# Patient Record
Sex: Female | Born: 1980 | Race: White | Hispanic: No | Marital: Married | Smoking: Current every day smoker
Health system: Southern US, Community
[De-identification: ages and names within clinical notes are randomized; demographics above are authoritative.]

## PROBLEM LIST (undated history)

## (undated) DIAGNOSIS — K589 Irritable bowel syndrome without diarrhea: Secondary | ICD-10-CM

## (undated) DIAGNOSIS — G971 Other reaction to spinal and lumbar puncture: Secondary | ICD-10-CM

## (undated) DIAGNOSIS — N879 Dysplasia of cervix uteri, unspecified: Secondary | ICD-10-CM

## (undated) DIAGNOSIS — J45909 Unspecified asthma, uncomplicated: Secondary | ICD-10-CM

## (undated) HISTORY — DX: Unspecified asthma, uncomplicated: J45.909

## (undated) HISTORY — DX: Dysplasia of cervix uteri, unspecified: N87.9

## (undated) HISTORY — DX: Irritable bowel syndrome, unspecified: K58.9

## (undated) HISTORY — PX: WISDOM TOOTH EXTRACTION: SHX21

---
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
## 2003-01-14 ENCOUNTER — Emergency Department (HOSPITAL_COMMUNITY): Admission: EM | Admit: 2003-01-14 | Discharge: 2003-01-14 | Payer: Self-pay | Admitting: Emergency Medicine

## 2007-03-16 ENCOUNTER — Emergency Department (HOSPITAL_COMMUNITY): Admission: EM | Admit: 2007-03-16 | Discharge: 2007-03-17 | Payer: Self-pay | Admitting: Emergency Medicine

## 2007-03-28 ENCOUNTER — Emergency Department (HOSPITAL_COMMUNITY): Admission: EM | Admit: 2007-03-28 | Discharge: 2007-03-28 | Payer: Self-pay | Admitting: Emergency Medicine

## 2009-09-14 ENCOUNTER — Emergency Department (HOSPITAL_COMMUNITY): Admission: EM | Admit: 2009-09-14 | Discharge: 2009-09-14 | Payer: Self-pay | Admitting: Emergency Medicine

## 2009-10-25 ENCOUNTER — Emergency Department (HOSPITAL_COMMUNITY): Admission: EM | Admit: 2009-10-25 | Discharge: 2009-10-25 | Payer: Self-pay | Admitting: Emergency Medicine

## 2010-07-19 ENCOUNTER — Emergency Department (HOSPITAL_BASED_OUTPATIENT_CLINIC_OR_DEPARTMENT_OTHER)
Admission: EM | Admit: 2010-07-19 | Discharge: 2010-07-19 | Payer: Self-pay | Source: Home / Self Care | Admitting: Emergency Medicine

## 2010-08-30 ENCOUNTER — Encounter (INDEPENDENT_AMBULATORY_CARE_PROVIDER_SITE_OTHER): Payer: Self-pay | Admitting: *Deleted

## 2010-09-08 NOTE — Letter (Signed)
Summary: New Patient letter  [facility]  [address]   [place], [place] [address]   Phone: [phone]  Fax: [phone]       08/30/2010 MRN: [id number]  [facility] Kendra Hurley [address] [place], [place]  [address]  Dear Ms. Kendra Hurley,  Welcome to the Gastroenterology Division at [facility].    You are scheduled to see Dr.  Russella Dar on 09/19/2010 at 3:45 on the 3rd floor at [facility], [address]  We ask that you try to arrive at our office 15 minutes prior to your appointment time to allow for check-in.  We would like you to complete the enclosed self-administered evaluation form prior to your visit and bring it with you on the day of your appointment.  We will review it with you.  Also, please bring a complete list of all your medications or, if you prefer, bring the medication bottles and we will list them.  Please bring your insurance card so that we may make a copy of it.  If your insurance requires a referral to see a specialist, please bring your referral form from your primary care physician.  Co-payments are due at the time of your visit and may be paid by cash, check or credit card.     Your office visit will consist of a consult with your physician (includes a physical exam), any laboratory testing he/she may order, scheduling of any necessary diagnostic testing (e.g. x-ray, ultrasound, CT-scan), and scheduling of a procedure (e.g. Endoscopy, Colonoscopy) if required.  Please allow enough time on your schedule to allow for any/all of these possibilities.    If you cannot keep your appointment, please call [phone] to cancel or reschedule prior to your appointment date.  This allows [place] the opportunity to schedule an appointment for another patient in need of care.  If you do not cancel or reschedule by 5 p.m. the business day prior to your appointment date, you will be charged a $50.00 late cancellation/no-show fee.    Thank you for choosing  [facility] for your medical needs.  We appreciate the opportunity to care for you.  Please visit [place] at our website  to learn more about our practice.                     Sincerely,                                                             The Gastroenterology Division

## 2010-09-19 ENCOUNTER — Ambulatory Visit: Payer: Self-pay | Admitting: Gastroenterology

## 2010-10-03 LAB — URINE MICROSCOPIC-ADD ON

## 2010-10-03 LAB — BASIC METABOLIC PANEL
BUN: 12 mg/dL (ref 6–23)
Calcium: 9.2 mg/dL (ref 8.4–10.5)
Creatinine, Ser: 0.8 mg/dL (ref 0.4–1.2)
GFR calc Af Amer: >60 mL/min (ref >60)
GFR calc non Af Amer: >60 mL/min (ref >60)

## 2010-10-03 LAB — URINALYSIS, ROUTINE W REFLEX MICROSCOPIC
Glucose, UA: NEGATIVE mg/dL
Leukocytes, UA: NEGATIVE
Nitrite: NEGATIVE
Specific Gravity, Urine: 1.024 (ref 1.005–1.030)
pH: 5.5 (ref 5.0–8.0)

## 2010-10-03 LAB — DIFFERENTIAL
Basophils Absolute: 0 10*3/uL (ref 0.0–0.1)
Eosinophils Relative: 3 % (ref 0–5)
Lymphocytes Relative: 25 % (ref 12–46)
Neutro Abs: 4.6 10*3/uL (ref 1.7–7.7)
Neutrophils Relative %: 65 % (ref 43–77)

## 2010-10-03 LAB — CBC
MCV: 85.6 fL (ref 78.0–100.0)
Platelets: 162 10*3/uL (ref 150–400)
RDW: 13.1 % (ref 11.5–15.5)
WBC: 7.1 10*3/uL (ref 4.0–10.5)

## 2010-10-03 LAB — PREGNANCY, URINE: Preg Test, Ur: NEGATIVE

## 2010-10-12 LAB — COMPREHENSIVE METABOLIC PANEL
BUN: 7 mg/dL (ref 6–23)
Calcium: 8.7 mg/dL (ref 8.4–10.5)
Glucose, Bld: 90 mg/dL (ref 70–99)
Total Protein: 7 g/dL (ref 6.0–8.3)

## 2010-10-12 LAB — DIFFERENTIAL
Lymphs Abs: 1.4 10*3/uL (ref 0.7–4.0)
Monocytes Relative: 7 % (ref 3–12)
Neutro Abs: 3.7 10*3/uL (ref 1.7–7.7)
Neutrophils Relative %: 66 % (ref 43–77)

## 2010-10-12 LAB — URINALYSIS, ROUTINE W REFLEX MICROSCOPIC
Ketones, ur: NEGATIVE mg/dL
Nitrite: NEGATIVE
pH: 5.5 (ref 5.0–8.0)

## 2010-10-12 LAB — CBC
HCT: 41.5 % (ref 36.0–46.0)
Hemoglobin: 14 g/dL (ref 12.0–15.0)
MCHC: 33.7 g/dL (ref 30.0–36.0)
RDW: 13.6 % (ref 11.5–15.5)

## 2010-10-12 LAB — URINE MICROSCOPIC-ADD ON

## 2010-12-09 NOTE — Discharge Summary (Signed)
[facility]  Patient:    Kendra Hurley, Kendra Hurley                       MRN: [id number] Adm. Date:  [id number] Disc. Date: [id number] Attending:  Milford Cage H                           Discharge Summary  PATIENT IDENTIFICATION:  Patient was a 30 year old female.  INITIAL ASSESSMENT AND DIAGNOSIS:  Patient was admitted to the service of Dr. Milford Cage.  She had a history of mood instability and conduct problems.  She had been depressed and irritable and was admitted after threatening to commit suicide.  She had symptoms of loss of energy, loss of interest, trouble sleeping, 6-pound weigh loss over two months.  She had gotten into an argument with her mother after being brought home from a friends home.  She had run away before that and had been living with this friend for the past three weeks.  MENTAL STATUS:  At the time of the initial evaluation, revealed a reserved young woman with poor eye contact.  There was no evidence of any psychotic thinking or behavior.  Mood was depressed.  There was no homicidal ideation. She was positive for suicidal ideation, per history.  Short and long-term memory were intact.  Concentration was somewhat impaired.  Insight was minimal.  Judgment was poor.  She seemed to be at least average intelligence. Other pertinent history can be obtained from the psychosocial service summary.  PHYSICAL EXAMINATION:  Noncontributory.  ADMITTING DIAGNOSES: Axis I:    1. Mood disorder not otherwise specified.            2. Conduct disorder, adolescent onset.            3. Rule out marijuana abuse. Axis II:   Deferred. Axis III:  Bronchitis. Axis IV:   Severe. Axis V:    15.  FINDINGS:  All indicated laboratory examinations were within normal limits or noncontributory.  HOSPITAL COURSE:  While in the hospital, patient was no problem on the unit. She went to groups.  She talked about her issues.  She behaved well.  She did have  difficulty, however, in her family sessions where she was negativistic and reasonably hostile towards the parents indicating that they needed to make the changes and she did not.  Consequently, the first family session was shortened.  By the second family session, she realized that it was more appropriate to say that she was willing to work with the parents and work out some compromises.  She was particularly unhappy with her stepfather and the way he handled things.  Her mother indicated that the rules would basically stay the same, that she was their child and she will be living at their house one way or the other and, by the time of discharge, patient was saying she was accepting of that and would follow the rules.  They also worked out a plan that she would have tutoring next year rather than school and she seemed to accept that idea.  She consistently denied any threats towards herself or anyone else while she was in the hospital and consequently was discharged.  POST-HOSPITAL CARE PLANS:  The outpatient follow-up was to be arranged at the time of discharge and those results are not on the chart at this point.  DISCHARGE MEDICATIONS: 1. Amoxicillin 500 mg t.i.d. x 5 days.  2. Prozac 20 mg q.a.m.  ACTIVITY AND DIET:  There were no restrictions placed on her activity or her diet.  FINAL DIAGNOSES: Axis I:    1. Mood disorder not otherwise specified.            2. Conduct disorder, adolescent onset. Axis II:   No diagnosis. Axis III:  Bronchitis. Axis IV:   Severe. Axis V:    60. DD:  12/12/99 TD:  12/13/99 Job: [id number]

## 2010-12-09 NOTE — H&P (Signed)
[facility]  Patient:    Kendra Hurley, Kendra Hurley                       MRN: [id number] Adm. Date:  [id number] Attending:  Jasmine Pang                   Psychiatric Admission Assessment  DATE OF ADMISSION:  Nov 30, 1999  PATIENT IDENTIFICATION:  This 30 year old Caucasian female admitted on a voluntary basis.  HISTORY OF PRESENT ILLNESS:  Patient has a history of mood instability and conduct problems.  She has been depressed and irritable and was admitted after threatening to commit suicide.  Other neurovegetative symptoms include anhedonia, anergia, insomnia and a 6-pound weight loss in the past two months.  She got into an argument with her mother after being retrieved from a friends home.  She had run away and she had been living with this friend for the past three weeks.  PAST PSYCHIATRIC HISTORY:  [facility] last year. Patient was noncompliant.  Patient is on no medications.  She was on Zoloft but stopped taking this.  SUBSTANCE ABUSE HISTORY:  Patient has been abusing marijuana.  She smokes a half a pack of cigarettes per day for the past two years.  PAST MEDICAL HISTORY:  No known drug allergies.  Patient suffers from bronchitis.  She is currently on amoxicillin 500 mg p.o. t.i.d. x 10 days and an albuterol inhaler p.r.n.  SOCIAL HISTORY:  Patient lives with her mother.  She is in the ninth grade. She has been having difficulty at school due to her marijuana abuse.  There is no physical or sexual abuse.  Family psychiatric history:  None reported. Legal history:  None.  MENTAL STATUS EXAMINATION:  On admission, patient presented as a reserved Caucasian female with poor eye contact.  Speech was soft and slow.  There was no articulation disorder.  There was psychomotor retardation.  Mood was depressed.  Affect sad and constricted.  There was positive suicidal ideation as per history of present illness.  There was no  homicidal ideation.  There was no psychosis or perceptual disturbance.  Thought processes were logical and goal directed.  Thought content revealed no predominant theme.  On cognitive exam, patient was alert and oriented x 4.  The short-term and long-term memory were adequate.  The general fund of knowledge was age and education level appropriate.  Attention and concentration poor as assessed by her difficulty focusing.  Insight minimal and judgment poor.  ADMISSION DIAGNOSES: Axis I:    1. Mood disorder not otherwise specified.            2. Conduct disorder, adolescent onset.            3. Rule out marijuana abuse. Axis II:   Deferred. Axis III:  Bronchitis. Axis IV:   Severe. Axis V:    GAF 15.  ASSETS AND STRENGTHS:  Patient is healthy and able to be physically active. Patient has a supportive family.  PROBLEM:  Mood instability with suicidal ideation.  SHORT-TERM TREATMENT GOAL:  Resolution of suicidal ideation.  LONG-TERM TREATMENT GOAL:  Resolution of mood instability.  INITIAL PLAN OF CARE:  Begin Depakote 125 mg p.o. t.i.d. x 2 days; then 250 mg p.o. t.i.d.  May also start an SSRI for depression after observing for awhile longer.  Patient will be involved in unit therapeutic groups and activities. Patient will be involved in family therapy.  ESTIMATED LENGTH OF STAY:  Five to seven days.  CONDITIONS NECESSARY FOR DISCHARGE:  Not suicidal.  POST HOSPITAL CARE PLAN:  Patient will return home to live with her family. Follow-up therapy and medications management will be at the [facility]. DD:  11/30/99 TD:  12/05/99 Job: [id number]

## 2011-08-31 ENCOUNTER — Emergency Department (HOSPITAL_COMMUNITY): Payer: PRIVATE HEALTH INSURANCE

## 2011-08-31 ENCOUNTER — Ambulatory Visit (INDEPENDENT_AMBULATORY_CARE_PROVIDER_SITE_OTHER): Payer: PRIVATE HEALTH INSURANCE | Admitting: Emergency Medicine

## 2011-08-31 ENCOUNTER — Emergency Department (HOSPITAL_COMMUNITY)
Admission: EM | Admit: 2011-08-31 | Discharge: 2011-08-31 | Disposition: A | Discharge status: Home / Self Care | Payer: PRIVATE HEALTH INSURANCE | Attending: Emergency Medicine | Admitting: Emergency Medicine

## 2011-08-31 DIAGNOSIS — R42 Dizziness and giddiness: Secondary | ICD-10-CM | POA: Insufficient documentation

## 2011-08-31 DIAGNOSIS — G4700 Insomnia, unspecified: Secondary | ICD-10-CM | POA: Insufficient documentation

## 2011-08-31 DIAGNOSIS — R110 Nausea: Secondary | ICD-10-CM | POA: Insufficient documentation

## 2011-08-31 DIAGNOSIS — R519 Headache, unspecified: Secondary | ICD-10-CM | POA: Insufficient documentation

## 2011-08-31 DIAGNOSIS — R279 Unspecified lack of coordination: Secondary | ICD-10-CM

## 2011-08-31 DIAGNOSIS — R51 Headache: Secondary | ICD-10-CM | POA: Insufficient documentation

## 2011-08-31 DIAGNOSIS — H8110 Benign paroxysmal vertigo, unspecified ear: Secondary | ICD-10-CM

## 2011-08-31 DIAGNOSIS — R630 Anorexia: Secondary | ICD-10-CM | POA: Insufficient documentation

## 2011-08-31 DIAGNOSIS — IMO0001 Reserved for inherently not codable concepts without codable children: Secondary | ICD-10-CM | POA: Insufficient documentation

## 2011-08-31 DIAGNOSIS — H53149 Visual discomfort, unspecified: Secondary | ICD-10-CM | POA: Insufficient documentation

## 2011-08-31 LAB — URINE MICROSCOPIC-ADD ON

## 2011-08-31 LAB — BASIC METABOLIC PANEL
CO2: 23 mEq/L (ref 19–32)
Calcium: 8.8 mg/dL (ref 8.4–10.5)
Creatinine, Ser: 0.7 mg/dL (ref 0.50–1.10)
Glucose, Bld: 89 mg/dL (ref 70–99)
Sodium: 143 mEq/L (ref 135–145)

## 2011-08-31 LAB — URINALYSIS, ROUTINE W REFLEX MICROSCOPIC
Bilirubin Urine: NEGATIVE
Glucose, UA: NEGATIVE mg/dL
Ketones, ur: NEGATIVE mg/dL
Leukocytes, UA: NEGATIVE
Nitrite: NEGATIVE
Protein, ur: NEGATIVE mg/dL
Specific Gravity, Urine: 1.006 (ref 1.005–1.030)
Urobilinogen, UA: 0.2 mg/dL (ref 0.0–1.0)
pH: 6 (ref 5.0–8.0)

## 2011-08-31 LAB — CBC
Hemoglobin: 13.6 g/dL (ref 12.0–15.0)
MCH: 29.6 pg (ref 26.0–34.0)
MCV: 88.5 fL (ref 78.0–100.0)
RBC: 4.6 MIL/uL (ref 3.87–5.11)

## 2011-08-31 LAB — PREGNANCY, URINE: Preg Test, Ur: NEGATIVE

## 2011-08-31 MED ORDER — KETOROLAC TROMETHAMINE 30 MG/ML IJ SOLN
30.0000 mg | Freq: Once | INTRAMUSCULAR | Status: AC
  Administered 2011-08-31: 30 mg via INTRAVENOUS
  Filled 2011-08-31: qty 1

## 2011-08-31 MED ORDER — ONDANSETRON 4 MG PO TBDP: 4.0000 mg | ORAL_TABLET | Freq: Once | ORAL | Status: DC

## 2011-08-31 MED ORDER — PROMETHAZINE HCL 25 MG/ML IJ SOLN
12.5000 mg | Freq: Once | INTRAMUSCULAR | Status: AC
  Administered 2011-08-31: 12.5 mg via INTRAVENOUS
  Filled 2011-08-31 (×2): qty 1

## 2011-08-31 MED ORDER — FENTANYL CITRATE 0.05 MG/ML IJ SOLN
100.0000 ug | Freq: Once | INTRAMUSCULAR | Status: AC
  Administered 2011-08-31: 100 ug via INTRAVENOUS
  Filled 2011-08-31: qty 2

## 2011-08-31 MED ORDER — PROMETHAZINE HCL 25 MG/ML IJ SOLN
12.5000 mg | INTRAMUSCULAR | Status: AC
  Administered 2011-08-31: 16:00:00 via INTRAVENOUS
  Filled 2011-08-31: qty 1

## 2011-08-31 MED ORDER — SODIUM CHLORIDE 0.9 % IV BOLUS (SEPSIS)
2000.0000 mL | Freq: Once | INTRAVENOUS | Status: AC
  Administered 2011-08-31: 2000 mL via INTRAVENOUS

## 2011-08-31 MED ORDER — NAPROXEN 500 MG PO TABS: 500.0000 mg | ORAL_TABLET | Freq: Two times a day (BID) | ORAL | Status: AC

## 2011-08-31 MED ORDER — SODIUM CHLORIDE 0.9 % IV SOLN
999.0000 mL | Freq: Once | INTRAVENOUS | Status: AC
  Administered 2011-08-31: 999 mL via INTRAVENOUS

## 2011-08-31 MED ORDER — DIPHENHYDRAMINE HCL 50 MG/ML IJ SOLN
25.0000 mg | Freq: Once | INTRAMUSCULAR | Status: AC
  Administered 2011-08-31: 25 mg via INTRAVENOUS
  Filled 2011-08-31: qty 1

## 2011-08-31 NOTE — ED Notes (Signed)
Pt tearful with c/o migraine at 10/10 and nausea w/o emesis.  IV to LAC #20 is patient.

## 2011-08-31 NOTE — ED Notes (Signed)
Pt evaluated by orthostatic VS in which the pt tolerated well with no syncope.

## 2011-08-31 NOTE — Progress Notes (Signed)
  Subjective:    Patient ID: Kendra Hurley, female    DOB: [birth year], 31 y.o.   MRN: [id number]  HPI most of the history is obtained from the husband. Patient presents with approximately 2 week history of just feeling poorly decreased appetite fatigue. He is intermittently had some headache morning of admission she presented with a severe headache that was associated with difficulty walking she did not complain of true diplopia but did have vertiginous symptoms when she tried to ambulate. She's been nauseated through the day she is complaining of a severe generalized headache patient was brought back emergently.    Review of Systems as described in the present     Objective:   Physical Exam patient needed assistance to be brought back she was unable to walk without assistance she was put on the exam table she kept her eyes closed shut but after an been talked to by the husband she was able to open them her pupils did appear to be reactive at times she did appear to have some disconjugate gaze but that could not be confirmed on repeat testing are small symmetric no other evidence of facial weakness. She did not have any decreased grip strength is able to elevate both legs. Cardiac exam was unremarkable and chin no carotid bruits. When asked about her husband's cell phone she was not able to give that number. She was able to give me her name but did not get the urine        Assessment & Plan:  Unclear what the etiology of her symptoms are related to be she did not have any focal neurological findings her main complaint was of headache vertigo and ataxia. She was sent emergently to the hospital for evaluation. The glucose was checked in the office and within normal limits. IV access was obtained and she was given 4 mg of sublingual Zofran.

## 2011-08-31 NOTE — ED Provider Notes (Signed)
Medical screening examination/treatment/procedure(s) were performed by non-physician practitioner and as supervising physician I was immediately available for consultation/collaboration.  Ethelda Chick, MD 08/31/11 [phone]

## 2011-08-31 NOTE — ED Provider Notes (Signed)
Headache improved.  Labs normal.  Patient states she is ready to return home.  Jimmye Norman, NP 08/31/11 1719

## 2011-08-31 NOTE — ED Notes (Signed)
Had intermittent nausea for several weeks, migraine that started several days ago.  Migraine became worse at 0830 today.  Pt reports photophobia and nosebleed this am.  No hx of HTN.

## 2011-08-31 NOTE — ED Provider Notes (Signed)
History     CSN: [id number]  Arrival date & time 08/31/11  1141   First MD Initiated Contact with Patient 08/31/11 1332      Chief Complaint  Patient presents with  . Migraine    photophobia  . Nausea    w/o emesis    (Consider location/radiation/quality/duration/timing/severity/associated sxs/prior treatment) HPI Patient presents to the ED today with a severe headache of 4 days duration. She states that she has had body aches for the past few weeks and there has been some headache during that time. She has also felt nauseous for the past few weeks, experiencing insomnia, and decreased appetite. She started having the headache on Monday and it has been a constant throbbing pain over her entire head. She has never had a headache this bad before. She does not have a history of migraines. Patient went to the urgent care this morning because her headache was worse and she had a nose bleed. She also reports dizziness, complaining of the room spinning, even when lying down. When asked about neck stiffness/pain she reports that her whole body hurts. She denies fever, chills, sweats, chest pain, shortness of breath, abdominal pain. She does not take any medications regularly. She has no allergies. No past medical history on file.  No past surgical history on file.  No family history on file.  History  Substance Use Topics  . Smoking status: Not on file  . Smokeless tobacco: Not on file  . Alcohol Use: Not on file    OB History    No data available      Review of Systems All pertinent positives and negatives reviewed in the history of present illness  Allergies  Review of patient's allergies indicates no known allergies.  Home Medications   Current Outpatient Rx  Name Route Sig Dispense Refill  . IBUPROFEN 200 MG PO TABS Oral Take 400 mg by mouth every 6 (six) hours as needed. For pain      BP 103/60  Pulse 70  Temp(Src) 98 F (36.7 C) (Oral)  Resp 16  SpO2 100%  LMP  08/28/2011  Physical Exam  Constitutional: She is oriented to person, place, and time. She appears well-developed and well-nourished. She appears distressed.  HENT:  Head: Normocephalic and atraumatic.  Mouth/Throat: Oropharynx is clear and moist. No oropharyngeal exudate.  Eyes: EOM are normal. Pupils are equal, round, and reactive to light.  Neck: Normal range of motion and full passive range of motion without pain. Neck supple. No rigidity. Normal range of motion present. No Brudzinski's sign and no Kernig's sign noted.  Cardiovascular: Normal rate, regular rhythm, normal heart sounds and intact distal pulses.  Exam reveals no friction rub.   No murmur heard. Pulmonary/Chest: Effort normal and breath sounds normal. No respiratory distress. She has no wheezes. She has no rales.  Abdominal: Soft. Bowel sounds are normal. She exhibits no distension. There is no tenderness. There is no rebound and no guarding.  Musculoskeletal: Normal range of motion.  Neurological: She is alert and oriented to person, place, and time. No cranial nerve deficit. She exhibits normal muscle tone. Coordination normal.  Skin: Skin is warm and dry. No rash noted.    ED Course  Procedures (including critical care time)  Labs Reviewed  CBC - Abnormal; Notable for the following:    Platelets 137 (*)    All other components within normal limits  BASIC METABOLIC PANEL  PREGNANCY, URINE  URINALYSIS, ROUTINE W REFLEX MICROSCOPIC  Ct Head Wo Contrast  08/31/2011  *RADIOLOGY REPORT*  Clinical Data: Migraine.  Nausea.  CT HEAD WITHOUT CONTRAST  Technique:  Contiguous axial images were obtained from the base of the skull through the vertex without contrast.  Comparison: None.  Findings: No mass effect, midline shift, or acute intracranial hemorrhage.  Ventricles system and extraaxial space are unremarkable.  Mastoid air cells are clear.  Visualized paranasal sinuses are clear.  IMPRESSION: Negative head CT.  Original  Report Authenticated By: Donavan Burnet, M.D.     The patient will be hydrated and monitored. The patient has no meningeal signs on exam. She has been stable while here in the department.     MDM          Carlyle Dolly, PA-C 08/31/11 [phone]

## 2011-08-31 NOTE — ED Notes (Signed)
Pt can follow commands, shrug shoulders.  No neuro deficits noted.  Pt grips and pedal pushes are WNL.  She still c/o headache and nausea w/o emesis.  Husband and friend at bedside.

## 2011-08-31 NOTE — ED Notes (Signed)
Pt has urine at bedside 

## 2011-08-31 NOTE — ED Provider Notes (Signed)
Medical screening examination/treatment/procedure(s) were performed by non-physician practitioner and as supervising physician I was immediately available for consultation/collaboration.    Glynn Octave, MD 08/31/11 2035

## 2012-04-19 LAB — OB RESULTS CONSOLE ABO/RH

## 2012-04-19 LAB — OB RESULTS CONSOLE RPR: RPR: NONREACTIVE

## 2012-04-19 LAB — OB RESULTS CONSOLE RUBELLA ANTIBODY, IGM: Rubella: IMMUNE

## 2012-04-19 LAB — OB RESULTS CONSOLE HIV ANTIBODY (ROUTINE TESTING): HIV: NONREACTIVE

## 2012-11-11 ENCOUNTER — Inpatient Hospital Stay (HOSPITAL_COMMUNITY): Payer: PRIVATE HEALTH INSURANCE | Admitting: Anesthesiology

## 2012-11-11 ENCOUNTER — Encounter (HOSPITAL_COMMUNITY): Payer: Self-pay | Admitting: Anesthesiology

## 2012-11-11 ENCOUNTER — Inpatient Hospital Stay (HOSPITAL_COMMUNITY)
Admission: RE | Admit: 2012-11-11 | Discharge: 2012-11-14 | DRG: 766 | Disposition: A | Discharge status: Home / Self Care | Payer: PRIVATE HEALTH INSURANCE | Source: Ambulatory Visit | Attending: Obstetrics and Gynecology | Admitting: Obstetrics and Gynecology

## 2012-11-11 ENCOUNTER — Encounter (HOSPITAL_COMMUNITY)
Admission: RE | Disposition: A | Discharge status: Home / Self Care | Payer: Self-pay | Source: Ambulatory Visit | Attending: Obstetrics and Gynecology

## 2012-11-11 ENCOUNTER — Encounter (HOSPITAL_COMMUNITY): Payer: Self-pay | Admitting: *Deleted

## 2012-11-11 DIAGNOSIS — Z302 Encounter for sterilization: Secondary | ICD-10-CM

## 2012-11-11 DIAGNOSIS — O34219 Maternal care for unspecified type scar from previous cesarean delivery: Principal | ICD-10-CM | POA: Diagnosis present

## 2012-11-11 HISTORY — DX: Other reaction to spinal and lumbar puncture: G97.1

## 2012-11-11 HISTORY — PX: TUBAL LIGATION: SHX77

## 2012-11-11 LAB — CBC
HCT: 34.2 % — ABNORMAL LOW (ref 36.0–46.0)
Hemoglobin: 11.4 g/dL — ABNORMAL LOW (ref 12.0–15.0)
MCHC: 33.3 g/dL (ref 30.0–36.0)
RBC: 3.77 MIL/uL — ABNORMAL LOW (ref 3.87–5.11)

## 2012-11-11 LAB — TYPE AND SCREEN: Antibody Screen: NEGATIVE

## 2012-11-11 LAB — RPR: RPR Ser Ql: REACTIVE — AB

## 2012-11-11 SURGERY — Surgical Case
Anesthesia: Spinal | Wound class: Clean Contaminated

## 2012-11-11 MED ORDER — MORPHINE SULFATE 0.5 MG/ML IJ SOLN
INTRAMUSCULAR | Status: AC
  Filled 2012-11-11: qty 10

## 2012-11-11 MED ORDER — NALBUPHINE SYRINGE 5 MG/0.5 ML
5.0000 mg | INJECTION | INTRAMUSCULAR | Status: DC | PRN
  Filled 2012-11-11: qty 1

## 2012-11-11 MED ORDER — FLEET ENEMA 7-19 GM/118ML RE ENEM: 1.0000 | ENEMA | RECTAL | Status: DC | PRN

## 2012-11-11 MED ORDER — FENTANYL CITRATE 0.05 MG/ML IJ SOLN
INTRAMUSCULAR | Status: DC | PRN
  Administered 2012-11-11: 25 ug via INTRATHECAL

## 2012-11-11 MED ORDER — DIPHENHYDRAMINE HCL 50 MG/ML IJ SOLN
INTRAMUSCULAR | Status: DC | PRN
  Administered 2012-11-11 (×3): 12.5 mg via INTRAVENOUS

## 2012-11-11 MED ORDER — EPHEDRINE 5 MG/ML INJ
INTRAVENOUS | Status: AC
  Filled 2012-11-11: qty 10

## 2012-11-11 MED ORDER — FENTANYL CITRATE 0.05 MG/ML IJ SOLN
INTRAMUSCULAR | Status: AC
  Filled 2012-11-11: qty 4

## 2012-11-11 MED ORDER — LIDOCAINE HCL (PF) 1 % IJ SOLN: 30.0000 mL | INTRAMUSCULAR | Status: DC | PRN

## 2012-11-11 MED ORDER — ONDANSETRON HCL 4 MG/2ML IJ SOLN
INTRAMUSCULAR | Status: AC
  Filled 2012-11-11: qty 2

## 2012-11-11 MED ORDER — SCOPOLAMINE 1 MG/3DAYS TD PT72
MEDICATED_PATCH | TRANSDERMAL | Status: AC
  Administered 2012-11-11: 1.5 mg via TRANSDERMAL
  Filled 2012-11-11: qty 1

## 2012-11-11 MED ORDER — DIPHENHYDRAMINE HCL 50 MG/ML IJ SOLN: 12.5000 mg | INTRAMUSCULAR | Status: DC | PRN

## 2012-11-11 MED ORDER — LACTATED RINGERS IV SOLN: Freq: Once | INTRAVENOUS | Status: DC

## 2012-11-11 MED ORDER — IBUPROFEN 600 MG PO TABS: 600.0000 mg | ORAL_TABLET | Freq: Four times a day (QID) | ORAL | Status: DC | PRN

## 2012-11-11 MED ORDER — PHENYLEPHRINE 40 MCG/ML (10ML) SYRINGE FOR IV PUSH (FOR BLOOD PRESSURE SUPPORT)
PREFILLED_SYRINGE | INTRAVENOUS | Status: AC
  Filled 2012-11-11: qty 5

## 2012-11-11 MED ORDER — ONDANSETRON HCL 4 MG/2ML IJ SOLN
INTRAMUSCULAR | Status: DC | PRN
  Administered 2012-11-11: 4 mg via INTRAVENOUS

## 2012-11-11 MED ORDER — HYDROMORPHONE HCL PF 1 MG/ML IJ SOLN
0.2500 mg | INTRAMUSCULAR | Status: DC | PRN
  Administered 2012-11-11 (×2): 0.5 mg via INTRAVENOUS

## 2012-11-11 MED ORDER — DIBUCAINE 1 % RE OINT: 1.0000 "application " | TOPICAL_OINTMENT | RECTAL | Status: DC | PRN

## 2012-11-11 MED ORDER — TETANUS-DIPHTH-ACELL PERTUSSIS 5-2.5-18.5 LF-MCG/0.5 IM SUSP: 0.5000 mL | Freq: Once | INTRAMUSCULAR | Status: DC

## 2012-11-11 MED ORDER — OXYCODONE-ACETAMINOPHEN 5-325 MG PO TABS: 1.0000 | ORAL_TABLET | ORAL | Status: DC | PRN

## 2012-11-11 MED ORDER — SIMETHICONE 80 MG PO CHEW
80.0000 mg | CHEWABLE_TABLET | Freq: Three times a day (TID) | ORAL | Status: DC
  Administered 2012-11-11 – 2012-11-12 (×2): 80 mg via ORAL

## 2012-11-11 MED ORDER — DIPHENHYDRAMINE HCL 50 MG/ML IJ SOLN: 25.0000 mg | INTRAMUSCULAR | Status: DC | PRN

## 2012-11-11 MED ORDER — ZOLPIDEM TARTRATE 5 MG PO TABS: 5.0000 mg | ORAL_TABLET | Freq: Every evening | ORAL | Status: DC | PRN

## 2012-11-11 MED ORDER — LACTATED RINGERS IV SOLN
INTRAVENOUS | Status: DC | PRN
  Administered 2012-11-11 (×5): via INTRAVENOUS

## 2012-11-11 MED ORDER — ACETAMINOPHEN 325 MG PO TABS: 650.0000 mg | ORAL_TABLET | ORAL | Status: DC | PRN

## 2012-11-11 MED ORDER — MEPERIDINE HCL 25 MG/ML IJ SOLN: 6.2500 mg | INTRAMUSCULAR | Status: DC | PRN

## 2012-11-11 MED ORDER — LANOLIN HYDROUS EX OINT: 1.0000 "application " | TOPICAL_OINTMENT | CUTANEOUS | Status: DC | PRN

## 2012-11-11 MED ORDER — KETOROLAC TROMETHAMINE 30 MG/ML IJ SOLN: 30.0000 mg | Freq: Four times a day (QID) | INTRAMUSCULAR | Status: DC | PRN

## 2012-11-11 MED ORDER — LACTATED RINGERS IV SOLN: INTRAVENOUS | Status: DC

## 2012-11-11 MED ORDER — ACETAMINOPHEN 10 MG/ML IV SOLN
1000.0000 mg | Freq: Four times a day (QID) | INTRAVENOUS | Status: DC | PRN
  Administered 2012-11-12: 1000 mg via INTRAVENOUS
  Filled 2012-11-11 (×2): qty 100

## 2012-11-11 MED ORDER — SODIUM CHLORIDE 0.9 % IJ SOLN: 3.0000 mL | INTRAMUSCULAR | Status: DC | PRN

## 2012-11-11 MED ORDER — NALOXONE HCL 0.4 MG/ML IJ SOLN: 0.4000 mg | INTRAMUSCULAR | Status: DC | PRN

## 2012-11-11 MED ORDER — SIMETHICONE 80 MG PO CHEW: 80.0000 mg | CHEWABLE_TABLET | ORAL | Status: DC | PRN

## 2012-11-11 MED ORDER — NALOXONE HCL 1 MG/ML IJ SOLN
1.0000 ug/kg/h | INTRAVENOUS | Status: DC | PRN
  Filled 2012-11-11: qty 2

## 2012-11-11 MED ORDER — CITRIC ACID-SODIUM CITRATE 334-500 MG/5ML PO SOLN: 30.0000 mL | ORAL | Status: DC | PRN

## 2012-11-11 MED ORDER — MENTHOL 3 MG MT LOZG: 1.0000 | LOZENGE | OROMUCOSAL | Status: DC | PRN

## 2012-11-11 MED ORDER — SCOPOLAMINE 1 MG/3DAYS TD PT72: 1.0000 | MEDICATED_PATCH | Freq: Once | TRANSDERMAL | Status: DC

## 2012-11-11 MED ORDER — WITCH HAZEL-GLYCERIN EX PADS: 1.0000 "application " | MEDICATED_PAD | CUTANEOUS | Status: DC | PRN

## 2012-11-11 MED ORDER — CEFAZOLIN SODIUM-DEXTROSE 2-3 GM-% IV SOLR: 2.0000 g | Freq: Once | INTRAVENOUS | Status: DC

## 2012-11-11 MED ORDER — DIPHENHYDRAMINE HCL 50 MG/ML IJ SOLN
INTRAMUSCULAR | Status: AC
  Filled 2012-11-11: qty 1

## 2012-11-11 MED ORDER — KETOROLAC TROMETHAMINE 60 MG/2ML IM SOLN
INTRAMUSCULAR | Status: AC
  Administered 2012-11-11: 60 mg via INTRAMUSCULAR
  Filled 2012-11-11: qty 2

## 2012-11-11 MED ORDER — SENNOSIDES-DOCUSATE SODIUM 8.6-50 MG PO TABS
2.0000 | ORAL_TABLET | Freq: Every day | ORAL | Status: DC
  Administered 2012-11-11: 2 via ORAL

## 2012-11-11 MED ORDER — EPHEDRINE SULFATE 50 MG/ML IJ SOLN
INTRAMUSCULAR | Status: DC | PRN
  Administered 2012-11-11 (×4): 5 mg via INTRAVENOUS

## 2012-11-11 MED ORDER — CEFAZOLIN SODIUM-DEXTROSE 2-3 GM-% IV SOLR
INTRAVENOUS | Status: AC
  Filled 2012-11-11: qty 50

## 2012-11-11 MED ORDER — LACTATED RINGERS IV SOLN
INTRAVENOUS | Status: DC
  Administered 2012-11-11: 18:00:00 via INTRAVENOUS

## 2012-11-11 MED ORDER — NALBUPHINE SYRINGE 5 MG/0.5 ML
INJECTION | INTRAMUSCULAR | Status: AC
  Administered 2012-11-11: 5 mg via SUBCUTANEOUS
  Filled 2012-11-11: qty 1

## 2012-11-11 MED ORDER — OXYTOCIN 10 UNIT/ML IJ SOLN
40.0000 [IU] | INTRAVENOUS | Status: DC | PRN
  Administered 2012-11-11: 40 [IU] via INTRAVENOUS

## 2012-11-11 MED ORDER — OXYTOCIN BOLUS FROM INFUSION
500.0000 mL | INTRAVENOUS | Status: DC
Start: 2012-11-11 — End: 2012-11-11

## 2012-11-11 MED ORDER — PRENATAL MULTIVITAMIN CH: 1.0000 | ORAL_TABLET | Freq: Every day | ORAL | Status: DC

## 2012-11-11 MED ORDER — DIPHENHYDRAMINE HCL 25 MG PO CAPS
25.0000 mg | ORAL_CAPSULE | ORAL | Status: DC | PRN
  Filled 2012-11-11: qty 1

## 2012-11-11 MED ORDER — MORPHINE SULFATE (PF) 0.5 MG/ML IJ SOLN
INTRAMUSCULAR | Status: DC | PRN
  Administered 2012-11-11: .15 mg via INTRATHECAL

## 2012-11-11 MED ORDER — PHENYLEPHRINE HCL 10 MG/ML IJ SOLN
INTRAMUSCULAR | Status: DC | PRN
  Administered 2012-11-11 (×5): 40 ug via INTRAVENOUS

## 2012-11-11 MED ORDER — LACTATED RINGERS IV SOLN
INTRAVENOUS | Status: DC
  Administered 2012-11-11 (×2): via INTRAVENOUS

## 2012-11-11 MED ORDER — HYDROMORPHONE HCL PF 1 MG/ML IJ SOLN
INTRAMUSCULAR | Status: AC
  Filled 2012-11-11: qty 1

## 2012-11-11 MED ORDER — LACTATED RINGERS IV SOLN: 500.0000 mL | INTRAVENOUS | Status: DC | PRN

## 2012-11-11 MED ORDER — CEFAZOLIN SODIUM-DEXTROSE 2-3 GM-% IV SOLR
INTRAVENOUS | Status: DC | PRN
  Administered 2012-11-11: 2 g via INTRAVENOUS

## 2012-11-11 MED ORDER — KETOROLAC TROMETHAMINE 60 MG/2ML IM SOLN: 60.0000 mg | Freq: Once | INTRAMUSCULAR | Status: AC | PRN

## 2012-11-11 MED ORDER — HYDROMORPHONE HCL PF 1 MG/ML IJ SOLN
INTRAMUSCULAR | Status: AC
  Administered 2012-11-11: 0.5 mg via INTRAVENOUS
  Filled 2012-11-11: qty 1

## 2012-11-11 MED ORDER — OXYTOCIN 10 UNIT/ML IJ SOLN
INTRAMUSCULAR | Status: AC
  Filled 2012-11-11: qty 4

## 2012-11-11 MED ORDER — ONDANSETRON HCL 4 MG/2ML IJ SOLN: 4.0000 mg | Freq: Three times a day (TID) | INTRAMUSCULAR | Status: DC | PRN

## 2012-11-11 MED ORDER — IBUPROFEN 600 MG PO TABS
600.0000 mg | ORAL_TABLET | Freq: Four times a day (QID) | ORAL | Status: DC
  Administered 2012-11-12 (×2): 600 mg via ORAL
  Filled 2012-11-11 (×2): qty 1

## 2012-11-11 MED ORDER — OXYTOCIN 40 UNITS IN LACTATED RINGERS INFUSION - SIMPLE MED: 62.5000 mL/h | INTRAVENOUS | Status: DC

## 2012-11-11 MED ORDER — METOCLOPRAMIDE HCL 5 MG/ML IJ SOLN: 10.0000 mg | Freq: Three times a day (TID) | INTRAMUSCULAR | Status: DC | PRN

## 2012-11-11 MED ORDER — LACTATED RINGERS IV SOLN
INTRAVENOUS | Status: DC | PRN
  Administered 2012-11-11 (×2): via INTRAVENOUS

## 2012-11-11 SURGICAL SUPPLY — 36 items
BENZOIN TINCTURE PRP APPL 2/3 (GAUZE/BANDAGES/DRESSINGS) ×4 IMPLANT
CLOTH BEACON ORANGE TIMEOUT ST (SAFETY) ×4 IMPLANT
CONTAINER PREFILL 10% NBF 15ML (MISCELLANEOUS) IMPLANT
DERMABOND ADVANCED (GAUZE/BANDAGES/DRESSINGS)
DERMABOND ADVANCED .7 DNX12 (GAUZE/BANDAGES/DRESSINGS) IMPLANT
DRAPE LG THREE QUARTER DISP (DRAPES) ×4 IMPLANT
DRSG OPSITE POSTOP 4X10 (GAUZE/BANDAGES/DRESSINGS) ×4 IMPLANT
DURAPREP 26ML APPLICATOR (WOUND CARE) ×4 IMPLANT
ELECT REM PT RETURN 9FT ADLT (ELECTROSURGICAL) ×4
ELECTRODE REM PT RTRN 9FT ADLT (ELECTROSURGICAL) ×3 IMPLANT
EXTRACTOR VACUUM M CUP 4 TUBE (SUCTIONS) IMPLANT
GLOVE BIO SURGEON STRL SZ8 (GLOVE) ×4 IMPLANT
GLOVE INDICATOR 7.0 STRL GRN (GLOVE) ×4 IMPLANT
GLOVE NEODERM STER SZ 7 (GLOVE) ×4 IMPLANT
GLOVE SURG ORTHO 7.0 STRL STRW (GLOVE) ×4 IMPLANT
GLOVE SURG SS PI 7.0 STRL IVOR (GLOVE) ×8 IMPLANT
GOWN STRL REIN XL XLG (GOWN DISPOSABLE) ×8 IMPLANT
GOWN SURGICAL LARGE (GOWNS) ×4 IMPLANT
KIT ABG SYR 3ML LUER SLIP (SYRINGE) ×4 IMPLANT
NEEDLE HYPO 25X5/8 SAFETYGLIDE (NEEDLE) ×4 IMPLANT
NS IRRIG 1000ML POUR BTL (IV SOLUTION) ×4 IMPLANT
PACK C SECTION WH (CUSTOM PROCEDURE TRAY) ×4 IMPLANT
PAD OB MATERNITY 4.3X12.25 (PERSONAL CARE ITEMS) ×4 IMPLANT
SLEEVE SCD COMPRESS KNEE MED (MISCELLANEOUS) IMPLANT
STAPLER VISISTAT 35W (STAPLE) IMPLANT
STRIP CLOSURE SKIN 1/2X4 (GAUZE/BANDAGES/DRESSINGS) ×4 IMPLANT
SUT CHROMIC 2 0 CT 1 (SUTURE) ×4 IMPLANT
SUT MNCRL 0 VIOLET CTX 36 (SUTURE) ×12 IMPLANT
SUT MONOCRYL 0 CTX 36 (SUTURE) ×4
SUT PDS AB 0 CTX 60 (SUTURE) ×4 IMPLANT
SUT PLAIN 0 NONE (SUTURE) IMPLANT
SUT VIC AB 4-0 KS 27 (SUTURE) ×4 IMPLANT
TAPE CLOTH SURG 4X10 WHT LF (GAUZE/BANDAGES/DRESSINGS) ×4 IMPLANT
TOWEL OR 17X24 6PK STRL BLUE (TOWEL DISPOSABLE) ×12 IMPLANT
TRAY FOLEY CATH 14FR (SET/KITS/TRAYS/PACK) ×4 IMPLANT
WATER STERILE IRR 1000ML POUR (IV SOLUTION) ×4 IMPLANT

## 2012-11-11 NOTE — Transfer of Care (Signed)
Immediate Anesthesia Transfer of Care Note  Patient: Kendra Hurley  Procedure(s) Performed: Procedure(s): CESAREAN SECTION (N/A) BILATERAL TUBAL LIGATION  Patient Location: PACU  Anesthesia Type:Spinal  Level of Consciousness: awake, alert , oriented and patient cooperative  Airway & Oxygen Therapy: Patient Spontanous Breathing  Post-op Assessment: Report given to PACU RN and Post -op Vital signs reviewed and stable  Post vital signs: Reviewed and stable  Complications: No apparent anesthesia complications

## 2012-11-11 NOTE — Anesthesia Postprocedure Evaluation (Signed)
  Anesthesia Post-op Note  Patient: Kendra Hurley  Procedure(s) Performed: Procedure(s): CESAREAN SECTION (N/A) BILATERAL TUBAL LIGATION  Patient is awake, responsive, moving her legs, and has signs of resolution of her numbness. Pain and nausea are reasonably well controlled. Vital signs are stable and clinically acceptable. Oxygen saturation is clinically acceptable. There are no apparent anesthetic complications at this time. Patient is ready for discharge.

## 2012-11-11 NOTE — Brief Op Note (Signed)
11/11/2012  3:42 PM  PATIENT:  Kendra Hurley  32 y.o. female G3P2  PRE-OPERATIVE DIAGNOSIS:  2 previous cesarean sections, desires sterility cpt 58611  POST-OPERATIVE DIAGNOSIS:  2 previous cesarean sections, desires sterility  PROCEDURE:  Procedure(s): CESAREAN SECTION (N/A) BILATERAL TUBAL LIGATION  SURGEON:  Surgeon(s) and Role:    * Leslie Andrea, MD - Primary  PHYSICIAN ASSISTANT:   ASSISTANTS: none   ANESTHESIA:   spinal  EBL:  Total I/O In: 2000 [I.V.:2000] Out: 1050 [Urine:450; Blood:600]  BLOOD ADMINISTERED:none  DRAINS: Urinary Catheter (Foley)   LOCAL MEDICATIONS USED:  NONE  SPECIMEN:  Source of Specimen:  placenta, bilateral fallopian tube segments  DISPOSITION OF SPECIMEN:  PATHOLOGY  COUNTS:  YES  TOURNIQUET:  * No tourniquets in log *  DICTATION: .Other Dictation: Dictation Number [id number]  PLAN OF CARE: Admit to inpatient   PATIENT DISPOSITION:  PACU - hemodynamically stable.   Delay start of Pharmacological VTE agent (>24hrs) due to surgical blood loss or risk of bleeding: not applicable

## 2012-11-11 NOTE — Anesthesia Procedure Notes (Signed)

## 2012-11-11 NOTE — H&P (Signed)
Kendra Hurley is a 32 y.o. female presenting for repeat cesarean section and bilateral tubal ligation. Previous cesarean section x 2. C/O increasing low back pain coming and going. No ROM or bleeding. No HA or vision change or epigastric pain. In office cx= closed/50%/very soft. NST is reactive with regular contractions. Maternal Medical History:  Reason for admission: Contractions.   Contractions: Onset was 2 days ago.   Frequency: regular.    Fetal activity: Perceived fetal activity is normal.      OB History   No data available     No past medical history on file. No past surgical history on file. Family History: family history is not on file. Social History:  has no tobacco, alcohol, and drug history on file.   Prenatal Transfer Tool  Maternal Diabetes: No Genetic Screening: Normal Maternal Ultrasounds/Referrals: Normal Fetal Ultrasounds or other Referrals:  None Maternal Substance Abuse:  No Significant Maternal Medications:  None Significant Maternal Lab Results:  None Other Comments:  None  Review of Systems  Eyes: Negative for blurred vision.  Gastrointestinal: Negative for abdominal pain.  Musculoskeletal: Positive for back pain.  Neurological: Negative for headaches.      Blood pressure 109/67, pulse 81, temperature 98.1 F (36.7 C), temperature source Oral, resp. rate 18, SpO2 98.00%. Maternal Exam:  Uterine Assessment: Contraction strength is moderate.  Contraction frequency is regular.   Abdomen: Fetal presentation: vertex     Fetal Exam Fetal Monitor Review: Pattern: accelerations present.       Physical Exam  Cardiovascular: Normal rate and regular rhythm.   Respiratory: Effort normal and breath sounds normal.  GI: Soft. Bowel sounds are normal. There is no tenderness.  Neurological: She has normal reflexes.    Prenatal labs: ABO, Rh:   Antibody:   Rubella:   RPR:    HBsAg:    HIV:    GBS:     Assessment/Plan: 32 yo at 59 3/7  weeks in labor with regular contractions and cervical effacement. Patient wants repeat cesarean section and BTL. D/W patient possible fetal lung immaturity, risks of surgery including infection, organ damage, bleeding/transfusion-HIV/hep, DVT/PE, pneumonia. D/W BTL-permanence, failure rate, increased ectopic risk. All questions answered.   Keanna Tugwell II,Demarian Epps E 11/11/2012, 1:02 PM

## 2012-11-11 NOTE — Anesthesia Preprocedure Evaluation (Signed)

## 2012-11-11 NOTE — Preoperative (Addendum)
Beta Blockers   Reason not to administer Beta Blockers:Not Applicable 

## 2012-11-12 ENCOUNTER — Encounter (HOSPITAL_COMMUNITY): Payer: Self-pay | Admitting: Obstetrics and Gynecology

## 2012-11-12 LAB — CBC
HCT: 30.8 % — ABNORMAL LOW (ref 36.0–46.0)
Hemoglobin: 10.2 g/dL — ABNORMAL LOW (ref 12.0–15.0)
MCV: 91.9 fL (ref 78.0–100.0)
Platelets: 137 10*3/uL — ABNORMAL LOW (ref 150–400)
RBC: 3.35 MIL/uL — ABNORMAL LOW (ref 3.87–5.11)
WBC: 10.4 10*3/uL (ref 4.0–10.5)

## 2012-11-12 MED ORDER — DIBUCAINE 1 % RE OINT: 1.0000 "application " | TOPICAL_OINTMENT | RECTAL | Status: DC | PRN

## 2012-11-12 MED ORDER — LACTATED RINGERS IV SOLN: INTRAVENOUS | Status: DC

## 2012-11-12 MED ORDER — SIMETHICONE 80 MG PO CHEW
80.0000 mg | CHEWABLE_TABLET | Freq: Three times a day (TID) | ORAL | Status: DC
  Administered 2012-11-12 – 2012-11-14 (×6): 80 mg via ORAL

## 2012-11-12 MED ORDER — ONDANSETRON HCL 4 MG/2ML IJ SOLN: 4.0000 mg | INTRAMUSCULAR | Status: DC | PRN

## 2012-11-12 MED ORDER — OXYTOCIN 40 UNITS IN LACTATED RINGERS INFUSION - SIMPLE MED: 62.5000 mL/h | INTRAVENOUS | Status: AC

## 2012-11-12 MED ORDER — ZOLPIDEM TARTRATE 5 MG PO TABS: 5.0000 mg | ORAL_TABLET | Freq: Every evening | ORAL | Status: DC | PRN

## 2012-11-12 MED ORDER — OXYCODONE-ACETAMINOPHEN 5-325 MG PO TABS
1.0000 | ORAL_TABLET | ORAL | Status: DC | PRN
  Administered 2012-11-12 (×4): 1 via ORAL
  Administered 2012-11-13: 2 via ORAL
  Administered 2012-11-13 (×2): 1 via ORAL
  Administered 2012-11-13 – 2012-11-14 (×5): 2 via ORAL
  Filled 2012-11-12: qty 2
  Filled 2012-11-12: qty 1
  Filled 2012-11-12 (×2): qty 2
  Filled 2012-11-12: qty 1
  Filled 2012-11-12: qty 2
  Filled 2012-11-12: qty 1
  Filled 2012-11-12: qty 2
  Filled 2012-11-12: qty 1
  Filled 2012-11-12 (×2): qty 2
  Filled 2012-11-12: qty 1

## 2012-11-12 MED ORDER — DIPHENHYDRAMINE HCL 25 MG PO CAPS: 25.0000 mg | ORAL_CAPSULE | Freq: Four times a day (QID) | ORAL | Status: DC | PRN

## 2012-11-12 MED ORDER — PRENATAL MULTIVITAMIN CH
1.0000 | ORAL_TABLET | Freq: Every day | ORAL | Status: DC
  Administered 2012-11-12 – 2012-11-13 (×2): 1 via ORAL
  Filled 2012-11-12 (×2): qty 1

## 2012-11-12 MED ORDER — LANOLIN HYDROUS EX OINT: 1.0000 "application " | TOPICAL_OINTMENT | CUTANEOUS | Status: DC | PRN

## 2012-11-12 MED ORDER — WITCH HAZEL-GLYCERIN EX PADS: 1.0000 "application " | MEDICATED_PAD | CUTANEOUS | Status: DC | PRN

## 2012-11-12 MED ORDER — TETANUS-DIPHTH-ACELL PERTUSSIS 5-2.5-18.5 LF-MCG/0.5 IM SUSP: 0.5000 mL | Freq: Once | INTRAMUSCULAR | Status: DC

## 2012-11-12 MED ORDER — SENNOSIDES-DOCUSATE SODIUM 8.6-50 MG PO TABS
2.0000 | ORAL_TABLET | Freq: Every day | ORAL | Status: DC
  Administered 2012-11-12 – 2012-11-13 (×2): 2 via ORAL

## 2012-11-12 MED ORDER — PRENATAL MULTIVITAMIN CH: 1.0000 | ORAL_TABLET | Freq: Every day | ORAL | Status: DC

## 2012-11-12 MED ORDER — BISACODYL 10 MG RE SUPP
10.0000 mg | Freq: Every day | RECTAL | Status: DC | PRN
  Administered 2012-11-12: 10 mg via RECTAL
  Filled 2012-11-12 (×2): qty 1

## 2012-11-12 MED ORDER — SIMETHICONE 80 MG PO CHEW
80.0000 mg | CHEWABLE_TABLET | ORAL | Status: DC | PRN
  Administered 2012-11-13: 80 mg via ORAL

## 2012-11-12 MED ORDER — IBUPROFEN 600 MG PO TABS
600.0000 mg | ORAL_TABLET | Freq: Four times a day (QID) | ORAL | Status: DC
  Administered 2012-11-12 – 2012-11-14 (×8): 600 mg via ORAL
  Filled 2012-11-12 (×8): qty 1

## 2012-11-12 MED ORDER — MENTHOL 3 MG MT LOZG: 1.0000 | LOZENGE | OROMUCOSAL | Status: DC | PRN

## 2012-11-12 MED ORDER — ONDANSETRON HCL 4 MG PO TABS: 4.0000 mg | ORAL_TABLET | ORAL | Status: DC | PRN

## 2012-11-12 NOTE — Op Note (Signed)
Kendra Hurley, Kendra Hurley            ACCOUNT NO.:  [id number]  MEDICAL RECORD NO.:  [id number]  LOCATION:  9103                          FACILITY:  [facility]  PHYSICIAN:  Guy Sandifer. Henderson Cloud, M.D. DATE OF BIRTH:  May 22, 1981  DATE OF PROCEDURE:  11/11/2012 DATE OF DISCHARGE:                              OPERATIVE REPORT   PREOPERATIVE DIAGNOSES: 1. Previous cesarean section x2. 2. Desires permanent sterilization. 3. Labor. 4. A 37 and 3/7th weeks.  POSTOPERATIVE DIAGNOSES: 1. Previous cesarean section x2. 2. Desires permanent sterilization. 3. Labor. 4. A 37 and 3/7th weeks.  PROCEDURE: 1. Repeat low transverse cesarean section. 2. Bilateral tubal ligation.  SURGEON:  Guy Sandifer. Henderson Cloud, MD  ANESTHESIA:  Spinal, Belva Agee, MD  ESTIMATED BLOOD LOSS:  750 mL.  FINDINGS:  Viable female infant, Apgars and arterial cord pH pending.  SPECIMENS: 1. Placenta. 2. Bilateral fallopian tube segments, all to pathology.  INDICATIONS AND CONSENT:  This patient is a 32 year old, G3, P2, with 2 previous cesarean sections.  She desires repeat.  She also desires permanent sterilization.  She presents to the office with regular uterine contractions.  Cervix is closed, 50%.  Monitoring in the office reveals regular firm uterine contractions.  Diagnosis of labor was made. Options were reviewed and the patient was transferred to the hospital for repeat cesarean section and tubal ligation.  Potential for pulmonary lung immaturity was discussed.  Potential risks of surgery were reviewed including but not limited to, infection, organ damage, bleeding requiring transfusion of blood products, HIV and hepatitis acquisition, DVT, PE, pneumonia.  Tubal ligation is reviewed including alternatives, permanence, failure rate, increased ectopic risk.  All questions were answered and consent was signed on the chart.  PROCEDURE:  The patient was taken to the operating room, she was identified, spinal  anesthetic was placed per Dr. Jean Rosenthal and she is placed in dorsal supine position with a 15 degree left lateral wedge. Foley catheter was placed.  She was prepped and draped in a sterile fashion per [facility] protocol.  After testing for adequate spinal anesthesia, skin was entered through the previous Pfannenstiel scar and dissection was carried out in layers to the peritoneum which was entered, extended superiorly and inferiorly.  The patient has adhesions of the bladder quite high on the lower uterine segment.  These were taken down safely and the urine remains clear.  Low transverse incision was made in the uterine cavity, it was entered bluntly with a hemostat. The uterine incision was extended cephalolaterally with fingers. Artificial rupture of membranes was carried out for clear fluid.  Vertex was delivered.  The loose nuchal cord was reduced and the baby was delivered.  Good cry and tone was noted.  Cord was clamped and cut and the baby was handed over to awaiting pediatrics team.  After drawing cord blood, it was noted, there was a small varix on the cord.  Placenta was delivered intact.  It was sent to pathology.  Uterine cavity was clean.  Uterus was closed in 2 running locking imbricating layers of 0 Monocryl suture which achieves good hemostasis.  Left fallopian tube was identified from cornu to fimbria.  It was grasped in its mid ampullary  portion with a Babcock clamp.  A knuckle of tube was then doubly ligated with 2 free ties of 0 plain suture.  The intervening knuckle was then sharply resected.  Cautery is used to assure complete hemostasis.  The right fallopian tube was identified from cornu to fimbria.  There is a few filmy adhesions around the distal portion of the tube.  The free portion of the tube was grasped with a Babcock clamp and a similar procedure was carried out to the left.  Again good hemostasis was noted. Irrigation was carried out.  Anterior  peritoneum was closed in running fashion with 0 Monocryl suture which was also used to reapproximate the pyramidalis muscle in the midline.  Anterior rectus fascia was closed in a running fashion with 0 looped PDS suture.  Adhesions in the subcutaneous layer was taken down with cautery.  Subcutaneous layers were reapproximated with plain interrupted suture.  Subcuticular stitch with 2-0 Vicryl on a Keith needle was then carried out.  Dressings were applied.  All counts were correct.  The patient was taken to recovery room in stable condition.     Guy Sandifer Henderson Cloud, M.D.     JET/MEDQ  D:  11/11/2012  T:  11/12/2012  Job:  [id number]

## 2012-11-12 NOTE — Progress Notes (Signed)
Subjective: Postpartum Day 1: Cesarean Delivery Patient reports tolerating PO.    Objective: Vital signs in last 24 hours: Temp:  [97.3 F (36.3 Hurley)-98.7 F (37.1 Hurley)] 97.8 F (36.6 Hurley) (04/22 0604) Pulse Rate:  [67-81] 71 (04/22 0604) Resp:  [16-25] 18 (04/22 0604) BP: (80-109)/(38-87) 101/58 mmHg (04/22 0604) SpO2:  [96 %-98 %] 96 % (04/22 0604) Weight:  [73.029 kg (161 lb)] 73.029 kg (161 lb) (04/21 1342)  Physical Exam:  General: alert, cooperative, appears stated age and no distress Lochia: appropriate Uterine Fundus: firm Incision: healing well DVT Evaluation: No evidence of DVT seen on physical exam.   Recent Labs  11/11/12 1255 11/12/12 0605  HGB 11.4* 10.2*  HCT 34.2* 30.8*    Assessment/Plan: Status post Cesarean section. Doing well postoperatively.  Continue current care.  Kendra Hurley 11/12/2012, 8:55 AM

## 2012-11-12 NOTE — Anesthesia Postprocedure Evaluation (Signed)
Anesthesia Post Note  Patient: Kendra Hurley  Procedure(s) Performed: Procedure(s) (LRB): CESAREAN SECTION (N/A) BILATERAL TUBAL LIGATION  Anesthesia type: SAB  Patient location: Mother/Baby  Post pain: Pain level controlled  Post assessment: Post-op Vital signs reviewed  Last Vitals:  Filed Vitals:   11/12/12 0604  BP: 101/58  Pulse: 71  Temp: 36.6 C  Resp: 18    Post vital signs: Reviewed  Level of consciousness: awake  Complications: No apparent anesthesia complications

## 2012-11-13 LAB — CBC
MCV: 92.6 fL (ref 78.0–100.0)
Platelets: 144 10*3/uL — ABNORMAL LOW (ref 150–400)
RBC: 3.63 MIL/uL — ABNORMAL LOW (ref 3.87–5.11)
WBC: 9.5 10*3/uL (ref 4.0–10.5)

## 2012-11-13 MED ORDER — BISACODYL 10 MG RE SUPP
10.0000 mg | Freq: Every day | RECTAL | Status: DC | PRN
  Administered 2012-11-13: 10 mg via RECTAL

## 2012-11-13 NOTE — Progress Notes (Signed)
Informed by off going nurse Lupita Dawn that patient has weak or questionable +RPR, Telephone call to Dr. Huel Coventry nurse in office, reveals prenatal RPR neg. on 04/12/12 .Nurse

## 2012-11-13 NOTE — Progress Notes (Signed)
Subjective: Postpartum Day 2: Cesarean Delivery Patient reports tolerating PO.    Objective: Vital signs in last 24 hours: Temp:  [97.8 F (36.6 C)-98.7 F (37.1 C)] 98.2 F (36.8 C) (04/23 0630) Pulse Rate:  [66-82] 68 (04/23 0630) Resp:  [18-20] 20 (04/23 0630) BP: (89-100)/(44-60) 90/47 mmHg (04/23 0630) SpO2:  [95 %] 95 % (04/22 1427)  Physical Exam:  General: alert Lochia: appropriate Uterine Fundus: firm Incision: healing well DVT Evaluation: No evidence of DVT seen on physical exam.   Recent Labs  11/12/12 0605 11/13/12 0700  HGB 10.2* 11.0*  HCT 30.8* 33.6*    Assessment/Plan: Status post Cesarean section. Doing well postoperatively.  Continue current care.  Meriel Pica 11/13/2012, 9:47 AM

## 2012-11-14 MED ORDER — OXYCODONE-ACETAMINOPHEN 5-325 MG PO TABS: 1.0000 | ORAL_TABLET | ORAL | Status: DC | PRN

## 2012-11-14 MED ORDER — IBUPROFEN 600 MG PO TABS: 600.0000 mg | ORAL_TABLET | Freq: Four times a day (QID) | ORAL | Status: DC

## 2012-11-14 NOTE — Discharge Summary (Signed)
Obstetric Discharge Summary Reason for Admission: cesarean section Prenatal Procedures: none Intrapartum Procedures: cesarean: low cervical, vertical and tubal ligation Postpartum Procedures: none Complications-Operative and Postpartum: none Hemoglobin  Date Value Range Status  11/13/2012 11.0* 12.0 - 15.0 g/dL Final     HCT  Date Value Range Status  11/13/2012 33.6* 36.0 - 46.0 % Final    Physical Exam:  General: alert, cooperative and appears stated age Lochia: appropriate Uterine Fundus: firm Incision: healing well, no significant drainage, no dehiscence DVT Evaluation: No evidence of DVT seen on physical exam. Negative Homan's sign. No cords or calf tenderness.  Discharge Diagnoses: Term Pregnancy-delivered  Discharge Information: Date: 11/14/2012 Activity: pelvic rest Diet: routine Medications: PNV, Ibuprofen and Percocet Condition: stable Instructions: refer to practice specific booklet Discharge to: home   Newborn Data: Live born female  Birth Weight: 6 lb 0.5 oz (2735 g) APGAR: 8, 9  Home with mother.  Kendra Hurley 11/14/2012, 9:14 AM

## 2012-11-14 NOTE — Progress Notes (Signed)
Subjective: Postpartum Day 3: Cesarean Delivery Patient reports incisional pain, tolerating PO and no problems voiding.    Objective: Vital signs in last 24 hours: Temp:  [98 F (36.7 C)-98.1 F (36.7 C)] 98.1 F (36.7 C) (04/24 0617) Pulse Rate:  [73-78] 78 (04/24 0617) Resp:  [18-20] 18 (04/24 0617) BP: (92-103)/(53-58) 103/53 mmHg (04/24 0617)  Physical Exam:  General: alert, cooperative and appears stated age Lochia: appropriate Uterine Fundus: firm Incision: healing well, no significant drainage, no dehiscence DVT Evaluation: No evidence of DVT seen on physical exam. Negative Homan's sign. No cords or calf tenderness.   Recent Labs  11/12/12 0605 11/13/12 0700  HGB 10.2* 11.0*  HCT 30.8* 33.6*    Assessment/Plan: Status post Cesarean section. Doing well postoperatively.  Discharge home with standard precautions and return to clinic in 4-6 weeks.  Kendra Hurley 11/14/2012, 9:11 AM

## 2012-11-22 ENCOUNTER — Inpatient Hospital Stay (HOSPITAL_COMMUNITY): Admission: RE | Admit: 2012-11-22 | Payer: Self-pay | Source: Ambulatory Visit

## 2013-02-21 ENCOUNTER — Emergency Department (HOSPITAL_BASED_OUTPATIENT_CLINIC_OR_DEPARTMENT_OTHER)
Admission: EM | Admit: 2013-02-21 | Discharge: 2013-02-22 | Disposition: A | Discharge status: Home / Self Care | Payer: Self-pay | Attending: Emergency Medicine | Admitting: Emergency Medicine

## 2013-02-21 ENCOUNTER — Emergency Department (HOSPITAL_BASED_OUTPATIENT_CLINIC_OR_DEPARTMENT_OTHER): Payer: Self-pay

## 2013-02-21 ENCOUNTER — Encounter (HOSPITAL_BASED_OUTPATIENT_CLINIC_OR_DEPARTMENT_OTHER): Payer: Self-pay | Admitting: *Deleted

## 2013-02-21 DIAGNOSIS — Z3202 Encounter for pregnancy test, result negative: Secondary | ICD-10-CM | POA: Insufficient documentation

## 2013-02-21 DIAGNOSIS — R6883 Chills (without fever): Secondary | ICD-10-CM | POA: Insufficient documentation

## 2013-02-21 DIAGNOSIS — R630 Anorexia: Secondary | ICD-10-CM | POA: Insufficient documentation

## 2013-02-21 DIAGNOSIS — R1031 Right lower quadrant pain: Secondary | ICD-10-CM

## 2013-02-21 DIAGNOSIS — F17200 Nicotine dependence, unspecified, uncomplicated: Secondary | ICD-10-CM | POA: Insufficient documentation

## 2013-02-21 DIAGNOSIS — G971 Other reaction to spinal and lumbar puncture: Secondary | ICD-10-CM | POA: Insufficient documentation

## 2013-02-21 DIAGNOSIS — R112 Nausea with vomiting, unspecified: Secondary | ICD-10-CM | POA: Insufficient documentation

## 2013-02-21 DIAGNOSIS — Z79899 Other long term (current) drug therapy: Secondary | ICD-10-CM | POA: Insufficient documentation

## 2013-02-21 LAB — COMPREHENSIVE METABOLIC PANEL
ALT: 32 U/L (ref 0–35)
BUN: 15 mg/dL (ref 6–23)
CO2: 25 mEq/L (ref 19–32)
Calcium: 10.1 mg/dL (ref 8.4–10.5)
Creatinine, Ser: 0.8 mg/dL (ref 0.50–1.10)
GFR calc Af Amer: >90 mL/min (ref >90)
GFR calc non Af Amer: >90 mL/min (ref >90)
Glucose, Bld: 96 mg/dL (ref 70–99)
Total Protein: 6.6 g/dL (ref 6.0–8.3)

## 2013-02-21 LAB — LIPASE, BLOOD: Lipase: 23 U/L (ref 11–59)

## 2013-02-21 LAB — URINALYSIS, ROUTINE W REFLEX MICROSCOPIC
Bilirubin Urine: NEGATIVE
Glucose, UA: NEGATIVE mg/dL
Hgb urine dipstick: NEGATIVE
Ketones, ur: NEGATIVE mg/dL
Leukocytes, UA: NEGATIVE
Nitrite: NEGATIVE
Protein, ur: NEGATIVE mg/dL
Specific Gravity, Urine: 1.009 (ref 1.005–1.030)
Urobilinogen, UA: 0.2 mg/dL (ref 0.0–1.0)
pH: 7 (ref 5.0–8.0)

## 2013-02-21 LAB — COMPREHENSIVE METABOLIC PANEL WITH GFR
AST: 28 U/L (ref 0–37)
Albumin: 4.1 g/dL (ref 3.5–5.2)
Alkaline Phosphatase: 58 U/L (ref 39–117)
Chloride: 105 meq/L (ref 96–112)
Potassium: 3.9 meq/L (ref 3.5–5.1)
Sodium: 141 meq/L (ref 135–145)
Total Bilirubin: 0.2 mg/dL — ABNORMAL LOW (ref 0.3–1.2)

## 2013-02-21 LAB — CBC WITH DIFFERENTIAL/PLATELET
Basophils Absolute: 0 K/uL (ref 0.0–0.1)
Basophils Relative: 0 % (ref 0–1)
Eosinophils Absolute: 0.2 10*3/uL (ref 0.0–0.7)
Eosinophils Relative: 2 % (ref 0–5)
HCT: 40.4 % (ref 36.0–46.0)
Hemoglobin: 13.5 g/dL (ref 12.0–15.0)
Lymphocytes Relative: 26 % (ref 12–46)
Lymphs Abs: 1.7 10*3/uL (ref 0.7–4.0)
MCH: 30.1 pg (ref 26.0–34.0)
MCHC: 33.4 g/dL (ref 30.0–36.0)
MCV: 90.2 fL (ref 78.0–100.0)
Monocytes Absolute: 0.4 10*3/uL (ref 0.1–1.0)
Monocytes Relative: 7 % (ref 3–12)
Neutro Abs: 4.2 K/uL (ref 1.7–7.7)
Neutrophils Relative %: 65 % (ref 43–77)
Platelets: 169 10*3/uL (ref 150–400)
RBC: 4.48 MIL/uL (ref 3.87–5.11)
RDW: 14.3 % (ref 11.5–15.5)
WBC: 6.4 10*3/uL (ref 4.0–10.5)

## 2013-02-21 LAB — WET PREP, GENITAL
Trich, Wet Prep: NONE SEEN
Yeast Wet Prep HPF POC: NONE SEEN

## 2013-02-21 LAB — PREGNANCY, URINE: Preg Test, Ur: NEGATIVE

## 2013-02-21 MED ORDER — MORPHINE SULFATE 4 MG/ML IJ SOLN
4.0000 mg | Freq: Once | INTRAMUSCULAR | Status: AC
  Administered 2013-02-22: 4 mg via INTRAVENOUS
  Filled 2013-02-21: qty 1

## 2013-02-21 MED ORDER — IOHEXOL 300 MG/ML  SOLN
100.0000 mL | Freq: Once | INTRAMUSCULAR | Status: AC | PRN
  Administered 2013-02-21: 100 mL via INTRAVENOUS

## 2013-02-21 MED ORDER — PROMETHAZINE HCL 25 MG PO TABS: 25.0000 mg | ORAL_TABLET | Freq: Four times a day (QID) | ORAL | Status: DC | PRN

## 2013-02-21 MED ORDER — MORPHINE SULFATE 4 MG/ML IJ SOLN
4.0000 mg | Freq: Once | INTRAMUSCULAR | Status: AC
  Administered 2013-02-21: 4 mg via INTRAVENOUS

## 2013-02-21 MED ORDER — OXYCODONE-ACETAMINOPHEN 5-325 MG PO TABS: ORAL_TABLET | ORAL | Status: DC

## 2013-02-21 MED ORDER — SODIUM CHLORIDE 0.9 % IV BOLUS (SEPSIS)
1000.0000 mL | Freq: Once | INTRAVENOUS | Status: AC
  Administered 2013-02-21: 1000 mL via INTRAVENOUS

## 2013-02-21 MED ORDER — ONDANSETRON HCL 4 MG/2ML IJ SOLN
4.0000 mg | Freq: Once | INTRAMUSCULAR | Status: AC
  Administered 2013-02-21: 4 mg via INTRAVENOUS
  Filled 2013-02-21: qty 2

## 2013-02-21 MED ORDER — IOHEXOL 300 MG/ML  SOLN
50.0000 mL | Freq: Once | INTRAMUSCULAR | Status: AC | PRN
  Administered 2013-02-21: 50 mL via ORAL

## 2013-02-21 MED ORDER — MORPHINE SULFATE 4 MG/ML IJ SOLN
4.0000 mg | Freq: Once | INTRAMUSCULAR | Status: AC
  Administered 2013-02-21: 4 mg via INTRAVENOUS
  Filled 2013-02-21: qty 1

## 2013-02-21 MED ORDER — MORPHINE SULFATE 4 MG/ML IJ SOLN
INTRAMUSCULAR | Status: AC
  Administered 2013-02-21: 4 mg via INTRAVENOUS
  Filled 2013-02-21: qty 1

## 2013-02-21 NOTE — ED Provider Notes (Signed)
CSN: [id number]     Arrival date & time 02/21/13  2011 History     First MD Initiated Contact with Patient 02/21/13 2111     Chief Complaint  Patient presents with  . Abdominal Pain   (Consider location/radiation/quality/duration/timing/severity/associated sxs/prior Treatment) HPI Comments: Pt has had several episodes of vomiting over the past 48 hours associated with worsening pain.  Possible chills, unsure of actual fever.  No urinary symptoms . No vaginal bleeding, dysuria.  She felt a small lump in right lower quadrant area.  No diarrhea . She has had 3 C/s's in the past, most recent one was in April.  Pt is not breast feeding.  Patient is a 32 y.o. female presenting with abdominal pain. The history is provided by the patient.  Abdominal Pain This is a new problem. The current episode started more than 2 days ago. The problem occurs constantly. The problem has been gradually worsening. Associated symptoms include abdominal pain. Pertinent negatives include no chest pain and no shortness of breath. The symptoms are aggravated by bending and eating. Nothing relieves the symptoms. She has tried acetaminophen for the symptoms. The treatment provided no relief.    Past Medical History  Diagnosis Date  . Spinal headache    Past Surgical History  Procedure Laterality Date  . Cesarean section  2006 and 2008  . Cesarean section N/A 11/11/2012    Procedure: CESAREAN SECTION;  Surgeon: Leslie Andrea, MD;  Location: [facility];  Service: Obstetrics;  Laterality: N/A;  . Tubal ligation  11/11/2012    Procedure: BILATERAL TUBAL LIGATION;  Surgeon: Leslie Andrea, MD;  Location: [facility];  Service: Obstetrics;;   History reviewed. No pertinent family history. History  Substance Use Topics  . Smoking status: Current Every Day Smoker -- 0.50 packs/day for 12 years    Types: Cigarettes    Last Attempt to Quit: 03/13/2012  . Smokeless tobacco: Not on file  . Alcohol Use: No   OB History    Grav Para Term Preterm Abortions TAB SAB Ect Mult Living   3 3 2  0 0     3     Review of Systems  Constitutional: Positive for chills and appetite change. Negative for fever.  Respiratory: Negative for cough and shortness of breath.   Cardiovascular: Negative for chest pain.  Gastrointestinal: Positive for nausea, vomiting and abdominal pain. Negative for diarrhea and constipation.  Genitourinary: Negative for dysuria, urgency, vaginal bleeding, vaginal discharge, difficulty urinating and vaginal pain.  Skin: Negative for rash.  Neurological: Negative for dizziness and light-headedness.  All other systems reviewed and are negative.    Allergies  Review of patient's allergies indicates no known allergies.  Home Medications   Current Outpatient Rx  Name  Route  Sig  Dispense  Refill  . acetaminophen (TYLENOL) 325 MG tablet   Oral   Take 650 mg by mouth every 6 (six) hours as needed for pain.         Marland Kitchen ibuprofen (ADVIL,MOTRIN) 200 MG tablet   Oral   Take 400 mg by mouth every 6 (six) hours as needed. For pain         . ibuprofen (ADVIL,MOTRIN) 600 MG tablet   Oral   Take 1 tablet (600 mg total) by mouth every 6 (six) hours.   30 tablet   0   . oxyCODONE-acetaminophen (PERCOCET/ROXICET) 5-325 MG per tablet      1-2 tablets po q 6 hours prn moderate to  severe pain   20 tablet   0   . oxyCODONE-acetaminophen (PERCOCET/ROXICET) 5-325 MG per tablet   Oral   Take 1-2 tablets by mouth every 4 (four) hours as needed.   30 tablet   0   . Prenatal Vit-Fe Fumarate-FA (PRENATAL MULTIVITAMIN) TABS   Oral   Take 1 tablet by mouth daily at 12 noon.         . promethazine (PHENERGAN) 25 MG tablet   Oral   Take 1 tablet (25 mg total) by mouth every 6 (six) hours as needed for nausea.   20 tablet   0    BP 111/62  Pulse 74  Temp(Src) 98.8 F (37.1 C) (Oral)  Resp 16  Ht 4\' 11"  (1.499 m)  Wt 126 lb 5 oz (57.295 kg)  BMI 25.5 kg/m2  SpO2 97%  LMP  01/23/2013 Physical Exam  Nursing note and vitals reviewed. Constitutional: She is oriented to person, place, and time. She appears well-developed and well-nourished. No distress.  HENT:  Head: Normocephalic and atraumatic.  Eyes: Conjunctivae and EOM are normal.  Neck: Normal range of motion. Neck supple.  Cardiovascular: Normal rate and regular rhythm.   Pulmonary/Chest: Effort normal. No respiratory distress. She has no wheezes.  Abdominal: Soft. Normal appearance and bowel sounds are normal. She exhibits no distension and no mass. There is tenderness in the right lower quadrant. There is no rebound, no guarding, no tenderness at McBurney's point and negative Murphy's sign. No hernia.  Genitourinary: There is no rash or tenderness on the right labia. There is no rash or tenderness on the left labia. Uterus is not tender. Cervix exhibits no motion tenderness and no discharge. Right adnexum displays tenderness. Right adnexum displays no fullness. Left adnexum displays no tenderness and no fullness. No tenderness around the vagina. No signs of injury around the vagina. No vaginal discharge found.  Chaperone was present  Neurological: She is alert and oriented to person, place, and time. No cranial nerve deficit. Coordination normal.  Skin: Skin is warm and dry. No rash noted. She is not diaphoretic.  Psychiatric: She has a normal mood and affect.    ED Course   Procedures (including critical care time)  Labs Reviewed  COMPREHENSIVE METABOLIC PANEL - Abnormal; Notable for the following:    Total Bilirubin 0.2 (*)    All other components within normal limits  WET PREP, GENITAL  GC/CHLAMYDIA PROBE AMP  URINALYSIS, ROUTINE W REFLEX MICROSCOPIC  PREGNANCY, URINE  CBC WITH DIFFERENTIAL  LIPASE, BLOOD   Ct Abdomen Pelvis W Contrast  02/21/2013   *RADIOLOGY REPORT*  Clinical Data: Right lower quadrant pain for 3 days.  CT ABDOMEN AND PELVIS WITH CONTRAST  Technique:  Multidetector CT imaging  of the abdomen and pelvis was performed following the standard protocol during bolus administration of intravenous contrast.  Contrast: 50mL OMNIPAQUE IOHEXOL 300 MG/ML  SOLN, OMNIPAQUE IOHEXOL 300 MG/ML  SOLN  Comparison: 07/19/2010.  Findings:  BODY WALL: Unremarkable.  LOWER CHEST:  Mediastinum: Unremarkable.  Lungs/pleura: No consolidation.  ABDOMEN/PELVIS:  Liver: No focal abnormality.  Biliary: No evidence of biliary obstruction or stone.  Pancreas: Unremarkable.  Spleen: Unremarkable.  Adrenals: Unremarkable.  Kidneys and ureters: 3 mm nonobstructive stone in the left lower pole. No hydronephrosis.  Bladder: Unremarkable.  Bowel: No obstruction. Normal appendix.  Retroperitoneum: No mass or adenopathy.  Peritoneum: Small-volume free fluid the pelvis, likely physiologic.  Reproductive: Left-sided corpus luteum. Band of hypoenhancement in the anterior lower uterine segment  consistent with prior cesarean section.  Vascular: No acute abnormality.  OSSEOUS: No acute abnormalities. No suspicious lytic or blastic lesions.  IMPRESSION:  1.  No acute intra-abdominal abnormality.  Normal appendix. 2.  3 mm, nonobstructive left nephrolithiasis.   Original Report Authenticated By: Tiburcio Pea   1. Right lower quadrant abdominal pain     RA sat is 97% and I interpret to be normal   11:38 PM Pt has had recurring pain, normal CT scan except for renal stone on left, corpus luteum cyst on left ovary.  On bimanual exam, tender at right adnexa, will get U/S.  If neg, pt can follow up with her own OB/GYN and receive Rx for pain and nausea.  Pt signed out to Dr. Read Drivers to follow up on wet prep and U/S.   MDM  Pt with RLQ for 5 days, now with N/V, doesn't appear toxic however, subjective fevers.  Will get abd CT to r/o appendicits.  No GYN symptoms.  Mild tenderness in RLQ.  No fever.  WBC is normal.  CT is pending.  IVF's, zofran, morphine for symptoms.    Gavin Pound. Oletta Lamas, MD 02/21/13 2342

## 2013-02-21 NOTE — ED Notes (Signed)
Right lower quad pain x 5 days. Worse with lifting. She also noticed a knot under the skin about the same time the pain started.

## 2013-02-21 NOTE — ED Notes (Signed)
Pt ambulatory to restroom without difficulty.

## 2013-02-21 NOTE — ED Notes (Signed)
MD at bedside. 

## 2013-02-21 NOTE — ED Notes (Signed)
Patient transported to ultrasound per tech via stretcher.

## 2013-02-22 NOTE — ED Provider Notes (Signed)
Nursing notes and vitals signs, including pulse oximetry, reviewed.  Summary of this visit's results, reviewed by myself:  Labs:  Results for orders placed during the hospital encounter of 02/21/13 (from the past 24 hour(s))  URINALYSIS, ROUTINE W REFLEX MICROSCOPIC     Status: None   Collection Time    02/21/13  8:21 PM      Result Value Range   Color, Urine YELLOW  YELLOW   APPearance CLEAR  CLEAR   Specific Gravity, Urine 1.009  1.005 - 1.030   pH 7.0  5.0 - 8.0   Glucose, UA NEGATIVE  NEGATIVE mg/dL   Hgb urine dipstick NEGATIVE  NEGATIVE   Bilirubin Urine NEGATIVE  NEGATIVE   Ketones, ur NEGATIVE  NEGATIVE mg/dL   Protein, ur NEGATIVE  NEGATIVE mg/dL   Urobilinogen, UA 0.2  0.0 - 1.0 mg/dL   Nitrite NEGATIVE  NEGATIVE   Leukocytes, UA NEGATIVE  NEGATIVE  PREGNANCY, URINE     Status: None   Collection Time    02/21/13  8:21 PM      Result Value Range   Preg Test, Ur NEGATIVE  NEGATIVE  CBC WITH DIFFERENTIAL     Status: None   Collection Time    02/21/13  9:12 PM      Result Value Range   WBC 6.4  4.0 - 10.5 K/uL   RBC 4.48  3.87 - 5.11 MIL/uL   Hemoglobin 13.5  12.0 - 15.0 g/dL   HCT 16.1  09.6 - 04.5 %   MCV 90.2  78.0 - 100.0 fL   MCH 30.1  26.0 - 34.0 pg   MCHC 33.4  30.0 - 36.0 g/dL   RDW 40.9  81.1 - 91.4 %   Platelets 169  150 - 400 K/uL   Neutrophils Relative % 65  43 - 77 %   Neutro Abs 4.2  1.7 - 7.7 K/uL   Lymphocytes Relative 26  12 - 46 %   Lymphs Abs 1.7  0.7 - 4.0 K/uL   Monocytes Relative 7  3 - 12 %   Monocytes Absolute 0.4  0.1 - 1.0 K/uL   Eosinophils Relative 2  0 - 5 %   Eosinophils Absolute 0.2  0.0 - 0.7 K/uL   Basophils Relative 0  0 - 1 %   Basophils Absolute 0.0  0.0 - 0.1 K/uL  COMPREHENSIVE METABOLIC PANEL     Status: Abnormal   Collection Time    02/21/13  9:12 PM      Result Value Range   Sodium 141  135 - 145 mEq/L   Potassium 3.9  3.5 - 5.1 mEq/L   Chloride 105  96 - 112 mEq/L   CO2 25  19 - 32 mEq/L   Glucose, Bld 96  70 -  99 mg/dL   BUN 15  6 - 23 mg/dL   Creatinine, Ser 7.82  0.50 - 1.10 mg/dL   Calcium 95.6  8.4 - 21.3 mg/dL   Total Protein 6.6  6.0 - 8.3 g/dL   Albumin 4.1  3.5 - 5.2 g/dL   AST 28  0 - 37 U/L   ALT 32  0 - 35 U/L   Alkaline Phosphatase 58  39 - 117 U/L   Total Bilirubin 0.2 (*) 0.3 - 1.2 mg/dL   GFR calc non Af Amer >90  >90 mL/min   GFR calc Af Amer >90  >90 mL/min  LIPASE, BLOOD     Status: None  Collection Time    02/21/13  9:21 PM      Result Value Range   Lipase 23  11 - 59 U/L  WET PREP, GENITAL     Status: Abnormal   Collection Time    02/21/13 11:40 PM      Result Value Range   Yeast Wet Prep HPF POC NONE SEEN  NONE SEEN   Trich, Wet Prep NONE SEEN  NONE SEEN   Clue Cells Wet Prep HPF POC FEW (*) NONE SEEN   WBC, Wet Prep HPF POC FEW (*) NONE SEEN    Imaging Studies: US Transvaginal Non-ob  2013/03/18   *RADIOLOGY REPORT*  Clinical Data:  Right lower quadrant pain for 5 days.  TRANSABDOMINAL AND TRANSVAGINAL ULTRASOUND OF PELVIS DOPPLER ULTRASOUND OF OVARIES  Technique:  Both transabdominal and transvaginal ultrasound examinations of the pelvis were performed. Transabdominal technique was performed for global imaging of the pelvis including uterus, ovaries, adnexal regions, and pelvic cul-de-sac.  It was necessary to proceed with endovaginal exam following the transabdominal exam to visualize the adnexal flow.  Color and duplex Doppler ultrasound was utilized to evaluate blood flow to the ovaries.  Comparison:  Pelvic ultrasound 09/03/2006.  FINDINGS  Uterus:  Measures 7 x 4 x 5 cm.  Hypoechoic area in the anterior lower uterine segment compatible with previous cesarean sections.  Endometrium:  13 mm diameter, with secretory phase appearance.  Right ovary: Normal appearance/no adnexal mass 1.7 x 1.8 x 2.7 cm  Left ovary: Normal appearance/no adnexal mass 2.3 x 1.4 x 1.6 cm.  Pulsed Doppler evaluation demonstrates normal low-resistance arterial and venous waveforms in both  ovaries.  Small-volume free fluid, simple in appearance and usually physiologic.  IMPRESSION:  Negative pelvic ultrasound.  No evidence of ovarian torsion.   Original Report Authenticated By: Tiburcio Pea   US Pelvis Complete  18-Mar-2013   *RADIOLOGY REPORT*  Clinical Data:  Right lower quadrant pain for 5 days.  TRANSABDOMINAL AND TRANSVAGINAL ULTRASOUND OF PELVIS DOPPLER ULTRASOUND OF OVARIES  Technique:  Both transabdominal and transvaginal ultrasound examinations of the pelvis were performed. Transabdominal technique was performed for global imaging of the pelvis including uterus, ovaries, adnexal regions, and pelvic cul-de-sac.  It was necessary to proceed with endovaginal exam following the transabdominal exam to visualize the adnexal flow.  Color and duplex Doppler ultrasound was utilized to evaluate blood flow to the ovaries.  Comparison:  Pelvic ultrasound 09/03/2006.  FINDINGS  Uterus:  Measures 7 x 4 x 5 cm.  Hypoechoic area in the anterior lower uterine segment compatible with previous cesarean sections.  Endometrium:  13 mm diameter, with secretory phase appearance.  Right ovary: Normal appearance/no adnexal mass 1.7 x 1.8 x 2.7 cm  Left ovary: Normal appearance/no adnexal mass 2.3 x 1.4 x 1.6 cm.  Pulsed Doppler evaluation demonstrates normal low-resistance arterial and venous waveforms in both ovaries.  Small-volume free fluid, simple in appearance and usually physiologic.  IMPRESSION:  Negative pelvic ultrasound.  No evidence of ovarian torsion.   Original Report Authenticated By: Tiburcio Pea   Ct Abdomen Pelvis W Contrast  02/21/2013   *RADIOLOGY REPORT*  Clinical Data: Right lower quadrant pain for 3 days.  CT ABDOMEN AND PELVIS WITH CONTRAST  Technique:  Multidetector CT imaging of the abdomen and pelvis was performed following the standard protocol during bolus administration of intravenous contrast.  Contrast: 50mL OMNIPAQUE IOHEXOL 300 MG/ML  SOLN, OMNIPAQUE IOHEXOL 300 MG/ML   SOLN  Comparison: 07/19/2010.  Findings:  BODY  WALL: Unremarkable.  LOWER CHEST:  Mediastinum: Unremarkable.  Lungs/pleura: No consolidation.  ABDOMEN/PELVIS:  Liver: No focal abnormality.  Biliary: No evidence of biliary obstruction or stone.  Pancreas: Unremarkable.  Spleen: Unremarkable.  Adrenals: Unremarkable.  Kidneys and ureters: 3 mm nonobstructive stone in the left lower pole. No hydronephrosis.  Bladder: Unremarkable.  Bowel: No obstruction. Normal appendix.  Retroperitoneum: No mass or adenopathy.  Peritoneum: Small-volume free fluid the pelvis, likely physiologic.  Reproductive: Left-sided corpus luteum. Band of hypoenhancement in the anterior lower uterine segment consistent with prior cesarean section.  Vascular: No acute abnormality.  OSSEOUS: No acute abnormalities. No suspicious lytic or blastic lesions.  IMPRESSION:  1.  No acute intra-abdominal abnormality.  Normal appendix. 2.  3 mm, nonobstructive left nephrolithiasis.   Original Report Authenticated By: Tiburcio Pea   [place] Art/ven Flow Abd Pelv Doppler  02/22/2013   *RADIOLOGY REPORT*  Clinical Data:  Right lower quadrant pain for 5 days.  TRANSABDOMINAL AND TRANSVAGINAL ULTRASOUND OF PELVIS DOPPLER ULTRASOUND OF OVARIES  Technique:  Both transabdominal and transvaginal ultrasound examinations of the pelvis were performed. Transabdominal technique was performed for global imaging of the pelvis including uterus, ovaries, adnexal regions, and pelvic cul-de-sac.  It was necessary to proceed with endovaginal exam following the transabdominal exam to visualize the adnexal flow.  Color and duplex Doppler ultrasound was utilized to evaluate blood flow to the ovaries.  Comparison:  Pelvic ultrasound 09/03/2006.  FINDINGS  Uterus:  Measures 7 x 4 x 5 cm.  Hypoechoic area in the anterior lower uterine segment compatible with previous cesarean sections.  Endometrium:  13 mm diameter, with secretory phase appearance.  Right ovary: Normal appearance/no  adnexal mass 1.7 x 1.8 x 2.7 cm  Left ovary: Normal appearance/no adnexal mass 2.3 x 1.4 x 1.6 cm.  Pulsed Doppler evaluation demonstrates normal low-resistance arterial and venous waveforms in both ovaries.  Small-volume free fluid, simple in appearance and usually physiologic.  IMPRESSION:  Negative pelvic ultrasound.  No evidence of ovarian torsion.   Original Report Authenticated By: Tammi Sou, MD 02/22/13 [phone]

## 2013-02-22 NOTE — ED Notes (Signed)
Pt back from ultrasound.

## 2013-02-23 LAB — GC/CHLAMYDIA PROBE AMP
CT Probe RNA: NEGATIVE
GC Probe RNA: NEGATIVE

## 2013-06-09 ENCOUNTER — Other Ambulatory Visit: Payer: Self-pay | Admitting: Obstetrics and Gynecology

## 2013-10-06 NOTE — Patient Instructions (Addendum)
   Your procedure is scheduled on:  Tuesday. Mar 24  [facility] at:  6AM Pick up the phone at the desk and dial [phone] and inform us of your arrival.  Please call this number if you have any problems the morning of surgery: [phone]  Remember: Do not eat or drink after midnight: Monday Take these medicines the morning of surgery with a SIP OF WATER:  None  Do not wear jewelry, make-up, or FINGER nail polish No metal in your hair or on your body. Do not wear lotions, powders, perfumes.  You may wear deodorant.  Do not bring valuables to the hospital. Contacts, dentures or bridgework may not be worn into surgery.  Leave suitcase in the car. After Surgery it may be brought to your room. For patients being admitted to the hospital, checkout time is 11:00am the day of discharge.  Home with husband Brad cell [phone].

## 2013-10-07 ENCOUNTER — Encounter (HOSPITAL_COMMUNITY)
Admission: RE | Admit: 2013-10-07 | Discharge: 2013-10-07 | Disposition: A | Discharge status: Home / Self Care | Payer: 59 | Source: Ambulatory Visit | Attending: Obstetrics and Gynecology | Admitting: Obstetrics and Gynecology

## 2013-10-07 ENCOUNTER — Encounter (HOSPITAL_COMMUNITY): Payer: Self-pay

## 2013-10-07 DIAGNOSIS — Z01812 Encounter for preprocedural laboratory examination: Secondary | ICD-10-CM | POA: Insufficient documentation

## 2013-10-07 LAB — CBC
HCT: 42.1 % (ref 36.0–46.0)
Hemoglobin: 14.4 g/dL (ref 12.0–15.0)
MCH: 30.8 pg (ref 26.0–34.0)
MCHC: 34.2 g/dL (ref 30.0–36.0)
MCV: 90 fL (ref 78.0–100.0)
Platelets: 139 10*3/uL — ABNORMAL LOW (ref 150–400)
RBC: 4.68 MIL/uL (ref 3.87–5.11)
RDW: 13.7 % (ref 11.5–15.5)
WBC: 6 10*3/uL (ref 4.0–10.5)

## 2013-10-07 NOTE — Pre-Procedure Instructions (Signed)
Informed Dr Arby BarretteHatchett of patient's low platelets 139 at today's PAT appt.  No orders given.  Ok for surgery.

## 2013-10-13 NOTE — H&P (Signed)
Kendra Hurley is an 33 y.o. female with heavy, painful menses. U/S c/w probable adenomyosis. Admitted for LAVH, bilateral salpingectomy, and removal of one ovary if abnormal.  Pertinent Gynecological History: Menses: flow is excessive with use of many pads or tampons on heaviest days Bleeding: dysfunctional uterine bleeding Contraception: tubal ligation DES exposure: denies Blood transfusions: none Sexually transmitted diseases: no past history Previous GYN Procedures: LEEP  Last mammogram: none Date: N/A Last pap: normal Date: 2014 OB History: G3, P3   Menstrual History: Menarche age: unknown  No LMP recorded.    Past Medical History  Diagnosis Date  . Spinal headache     otc med prn    Past Surgical History  Procedure Laterality Date  . Cesarean section  2006 and 2008  . Cesarean section N/A 11/11/2012    Procedure: CESAREAN SECTION;  Surgeon: Leslie AndreaJames E Lavra Imler II, MD;  Location: [facility];  Service: Obstetrics;  Laterality: N/A;  . Tubal ligation  11/11/2012    Procedure: BILATERAL TUBAL LIGATION;  Surgeon: Leslie AndreaJames E Kushi Kun II, MD;  Location: [facility];  Service: Obstetrics;;  . Wisdom tooth extraction      No family history on file.  Social History:  reports that she has been smoking Cigarettes.  She has a 7.5 pack-year smoking history. She has never used smokeless tobacco. She reports that she drinks alcohol. She reports that she does not use illicit drugs.  Allergies: No Known Allergies  No prescriptions prior to admission    Review of Systems  Constitutional: Negative for fever.    not currently breastfeeding. Physical Exam  Cardiovascular: Normal rate and regular rhythm.   Respiratory: Effort normal and breath sounds normal.  GI: Soft. There is no tenderness.  Genitourinary:  Uterus 6 weeks size, mobile Adnexa NT without masses  Neurological: She has normal reflexes.    No results found for this or any previous visit (from the past 24 hour(s)).  No results  found.  Assessment/Plan: 33 yo G3P3 S/P BTL with heavy, painful menses for LAVH, bilateral salpingectomy, possible removal of one ovary Risks reviewed preop with patient including infection, organ damage, bleeding/transfusion-HIV/Hep, DVT/PE, pneumonia, laparotomy, fistula, pelvic/abdominal pain, painful intercourse, return to OR. All questions answered.  Matraca Hunkins II,Strider Vallance E 10/13/2013, 6:27 PM

## 2013-10-14 ENCOUNTER — Ambulatory Visit (HOSPITAL_COMMUNITY): Payer: 59 | Admitting: Anesthesiology

## 2013-10-14 ENCOUNTER — Observation Stay (HOSPITAL_COMMUNITY)
Admission: RE | Admit: 2013-10-14 | Discharge: 2013-10-15 | Disposition: A | Discharge status: Home / Self Care | Payer: 59 | Source: Ambulatory Visit | Attending: Obstetrics and Gynecology | Admitting: Obstetrics and Gynecology

## 2013-10-14 ENCOUNTER — Encounter (HOSPITAL_COMMUNITY): Payer: Self-pay | Admitting: Anesthesiology

## 2013-10-14 ENCOUNTER — Encounter (HOSPITAL_COMMUNITY): Payer: 59 | Admitting: Anesthesiology

## 2013-10-14 ENCOUNTER — Encounter (HOSPITAL_COMMUNITY)
Admission: RE | Disposition: A | Discharge status: Home / Self Care | Payer: Self-pay | Source: Ambulatory Visit | Attending: Obstetrics and Gynecology

## 2013-10-14 DIAGNOSIS — N920 Excessive and frequent menstruation with regular cycle: Secondary | ICD-10-CM | POA: Diagnosis present

## 2013-10-14 DIAGNOSIS — N736 Female pelvic peritoneal adhesions (postinfective): Secondary | ICD-10-CM | POA: Insufficient documentation

## 2013-10-14 DIAGNOSIS — F17200 Nicotine dependence, unspecified, uncomplicated: Secondary | ICD-10-CM | POA: Insufficient documentation

## 2013-10-14 DIAGNOSIS — N949 Unspecified condition associated with female genital organs and menstrual cycle: Secondary | ICD-10-CM | POA: Insufficient documentation

## 2013-10-14 DIAGNOSIS — N3289 Other specified disorders of bladder: Secondary | ICD-10-CM | POA: Insufficient documentation

## 2013-10-14 DIAGNOSIS — N938 Other specified abnormal uterine and vaginal bleeding: Secondary | ICD-10-CM | POA: Insufficient documentation

## 2013-10-14 DIAGNOSIS — N946 Dysmenorrhea, unspecified: Principal | ICD-10-CM | POA: Insufficient documentation

## 2013-10-14 HISTORY — PX: LAPAROSCOPIC ASSISTED VAGINAL HYSTERECTOMY: SHX5398

## 2013-10-14 SURGERY — HYSTERECTOMY, VAGINAL, LAPAROSCOPY-ASSISTED
Anesthesia: General

## 2013-10-14 MED ORDER — HYDROMORPHONE HCL PF 1 MG/ML IJ SOLN
INTRAMUSCULAR | Status: AC
  Filled 2013-10-14: qty 1

## 2013-10-14 MED ORDER — HYDROMORPHONE HCL PF 1 MG/ML IJ SOLN
0.2500 mg | INTRAMUSCULAR | Status: DC | PRN
  Administered 2013-10-14 (×6): 0.5 mg via INTRAVENOUS

## 2013-10-14 MED ORDER — NEOSTIGMINE METHYLSULFATE 1 MG/ML IJ SOLN
INTRAMUSCULAR | Status: AC
  Filled 2013-10-14: qty 1

## 2013-10-14 MED ORDER — LIDOCAINE-EPINEPHRINE (PF) 2 %-1:200000 IJ SOLN
INTRAMUSCULAR | Status: AC
  Filled 2013-10-14: qty 20

## 2013-10-14 MED ORDER — OXYCODONE-ACETAMINOPHEN 5-325 MG PO TABS
1.0000 | ORAL_TABLET | ORAL | Status: DC | PRN
  Administered 2013-10-14: 2 via ORAL
  Administered 2013-10-15 (×2): 1 via ORAL
  Administered 2013-10-15 (×2): 2 via ORAL
  Filled 2013-10-14: qty 1
  Filled 2013-10-14: qty 2
  Filled 2013-10-14: qty 1
  Filled 2013-10-14 (×2): qty 2

## 2013-10-14 MED ORDER — MIDAZOLAM HCL 2 MG/2ML IJ SOLN
INTRAMUSCULAR | Status: AC
  Filled 2013-10-14: qty 2

## 2013-10-14 MED ORDER — LACTATED RINGERS IV SOLN
INTRAVENOUS | Status: DC
  Administered 2013-10-14 (×3): via INTRAVENOUS

## 2013-10-14 MED ORDER — PROPOFOL 10 MG/ML IV EMUL
INTRAVENOUS | Status: AC
  Filled 2013-10-14: qty 20

## 2013-10-14 MED ORDER — LIDOCAINE HCL (CARDIAC) 20 MG/ML IV SOLN
INTRAVENOUS | Status: DC | PRN
  Administered 2013-10-14: 70 mg via INTRAVENOUS
  Administered 2013-10-14: 30 mg via INTRAVENOUS

## 2013-10-14 MED ORDER — ONDANSETRON HCL 4 MG/2ML IJ SOLN
INTRAMUSCULAR | Status: AC
  Filled 2013-10-14: qty 2

## 2013-10-14 MED ORDER — SIMETHICONE 80 MG PO CHEW: 80.0000 mg | CHEWABLE_TABLET | Freq: Four times a day (QID) | ORAL | Status: DC | PRN

## 2013-10-14 MED ORDER — HYDROMORPHONE HCL PF 1 MG/ML IJ SOLN
INTRAMUSCULAR | Status: DC | PRN
  Administered 2013-10-14 (×2): 0.5 mg via INTRAVENOUS

## 2013-10-14 MED ORDER — MENTHOL 3 MG MT LOZG: 1.0000 | LOZENGE | OROMUCOSAL | Status: DC | PRN

## 2013-10-14 MED ORDER — PHENYLEPHRINE HCL 10 MG/ML IJ SOLN
INTRAMUSCULAR | Status: DC | PRN
  Administered 2013-10-14: 40 ug via INTRAVENOUS
  Administered 2013-10-14: 80 ug via INTRAVENOUS

## 2013-10-14 MED ORDER — CEFAZOLIN SODIUM-DEXTROSE 2-3 GM-% IV SOLR
INTRAVENOUS | Status: AC
  Filled 2013-10-14: qty 50

## 2013-10-14 MED ORDER — FENTANYL CITRATE 0.05 MG/ML IJ SOLN
INTRAMUSCULAR | Status: DC | PRN
  Administered 2013-10-14: 50 ug via INTRAVENOUS
  Administered 2013-10-14: 100 ug via INTRAVENOUS
  Administered 2013-10-14 (×2): 50 ug via INTRAVENOUS

## 2013-10-14 MED ORDER — ACETAMINOPHEN 160 MG/5ML PO SOLN
975.0000 mg | Freq: Once | ORAL | Status: AC
  Administered 2013-10-14: 975 mg via ORAL

## 2013-10-14 MED ORDER — LACTATED RINGERS IV SOLN
INTRAVENOUS | Status: DC
  Administered 2013-10-14 (×2): via INTRAVENOUS

## 2013-10-14 MED ORDER — DIPHENHYDRAMINE HCL 50 MG/ML IJ SOLN
12.5000 mg | Freq: Four times a day (QID) | INTRAMUSCULAR | Status: DC | PRN
Start: 2013-10-14 — End: 2013-10-15

## 2013-10-14 MED ORDER — MIDAZOLAM HCL 2 MG/2ML IJ SOLN
INTRAMUSCULAR | Status: DC | PRN
  Administered 2013-10-14: 2 mg via INTRAVENOUS

## 2013-10-14 MED ORDER — NALOXONE HCL 0.4 MG/ML IJ SOLN: 0.4000 mg | INTRAMUSCULAR | Status: DC | PRN

## 2013-10-14 MED ORDER — DIPHENHYDRAMINE HCL 12.5 MG/5ML PO ELIX
12.5000 mg | ORAL_SOLUTION | Freq: Four times a day (QID) | ORAL | Status: DC | PRN
  Filled 2013-10-14: qty 5

## 2013-10-14 MED ORDER — HYDROMORPHONE 0.3 MG/ML IV SOLN
INTRAVENOUS | Status: DC
  Administered 2013-10-14: 2.67 mg via INTRAVENOUS
  Administered 2013-10-14: 12:00:00 via INTRAVENOUS
  Administered 2013-10-14: 2.59 mg via INTRAVENOUS
  Filled 2013-10-14: qty 25

## 2013-10-14 MED ORDER — ONDANSETRON HCL 4 MG PO TABS: 4.0000 mg | ORAL_TABLET | Freq: Four times a day (QID) | ORAL | Status: DC | PRN

## 2013-10-14 MED ORDER — MEPERIDINE HCL 25 MG/ML IJ SOLN: 6.2500 mg | INTRAMUSCULAR | Status: DC | PRN

## 2013-10-14 MED ORDER — IBUPROFEN 600 MG PO TABS
600.0000 mg | ORAL_TABLET | Freq: Four times a day (QID) | ORAL | Status: DC | PRN
  Administered 2013-10-14 – 2013-10-15 (×2): 600 mg via ORAL
  Filled 2013-10-14 (×4): qty 1

## 2013-10-14 MED ORDER — ROCURONIUM BROMIDE 100 MG/10ML IV SOLN
INTRAVENOUS | Status: DC | PRN
  Administered 2013-10-14: 10 mg via INTRAVENOUS
  Administered 2013-10-14: 40 mg via INTRAVENOUS

## 2013-10-14 MED ORDER — HEPARIN SODIUM (PORCINE) 5000 UNIT/ML IJ SOLN
INTRAMUSCULAR | Status: AC
  Filled 2013-10-14: qty 1

## 2013-10-14 MED ORDER — DEXAMETHASONE SODIUM PHOSPHATE 10 MG/ML IJ SOLN
INTRAMUSCULAR | Status: DC | PRN
  Administered 2013-10-14: 10 mg via INTRAVENOUS

## 2013-10-14 MED ORDER — DOCUSATE SODIUM 100 MG PO CAPS
100.0000 mg | ORAL_CAPSULE | Freq: Two times a day (BID) | ORAL | Status: DC
  Administered 2013-10-14 – 2013-10-15 (×2): 100 mg via ORAL
  Filled 2013-10-14 (×2): qty 1

## 2013-10-14 MED ORDER — LIDOCAINE HCL (CARDIAC) 20 MG/ML IV SOLN
INTRAVENOUS | Status: AC
  Filled 2013-10-14: qty 5

## 2013-10-14 MED ORDER — KETOROLAC TROMETHAMINE 30 MG/ML IJ SOLN
INTRAMUSCULAR | Status: AC
  Filled 2013-10-14: qty 1

## 2013-10-14 MED ORDER — ACETAMINOPHEN 160 MG/5ML PO SOLN
ORAL | Status: AC
  Filled 2013-10-14: qty 40.6

## 2013-10-14 MED ORDER — ONDANSETRON HCL 4 MG/2ML IJ SOLN
4.0000 mg | Freq: Four times a day (QID) | INTRAMUSCULAR | Status: DC | PRN
Start: 2013-10-14 — End: 2013-10-15
  Filled 2013-10-14: qty 2

## 2013-10-14 MED ORDER — GLYCOPYRROLATE 0.2 MG/ML IJ SOLN
INTRAMUSCULAR | Status: DC | PRN
  Administered 2013-10-14: 0.6 mg via INTRAVENOUS

## 2013-10-14 MED ORDER — BUPIVACAINE HCL (PF) 0.5 % IJ SOLN
INTRAMUSCULAR | Status: AC
  Filled 2013-10-14: qty 30

## 2013-10-14 MED ORDER — LACTATED RINGERS IR SOLN
Status: DC | PRN
  Administered 2013-10-14: 3000 mL

## 2013-10-14 MED ORDER — SODIUM CHLORIDE 0.9 % IJ SOLN: 9.0000 mL | INTRAMUSCULAR | Status: DC | PRN

## 2013-10-14 MED ORDER — BUPIVACAINE HCL (PF) 0.5 % IJ SOLN
INTRAMUSCULAR | Status: DC | PRN
  Administered 2013-10-14: 5 mL

## 2013-10-14 MED ORDER — ROCURONIUM BROMIDE 100 MG/10ML IV SOLN
INTRAVENOUS | Status: AC
  Filled 2013-10-14: qty 1

## 2013-10-14 MED ORDER — SODIUM CHLORIDE 0.9 % IJ SOLN
INTRAMUSCULAR | Status: AC
  Filled 2013-10-14: qty 10

## 2013-10-14 MED ORDER — NEOSTIGMINE METHYLSULFATE 1 MG/ML IJ SOLN
INTRAMUSCULAR | Status: DC | PRN
  Administered 2013-10-14: 3 mg via INTRAVENOUS

## 2013-10-14 MED ORDER — CEFAZOLIN SODIUM-DEXTROSE 2-3 GM-% IV SOLR
2.0000 g | INTRAVENOUS | Status: AC
  Administered 2013-10-14: 2 g via INTRAVENOUS

## 2013-10-14 MED ORDER — METOCLOPRAMIDE HCL 5 MG/ML IJ SOLN: 10.0000 mg | Freq: Once | INTRAMUSCULAR | Status: DC | PRN

## 2013-10-14 MED ORDER — PHENYLEPHRINE 40 MCG/ML (10ML) SYRINGE FOR IV PUSH (FOR BLOOD PRESSURE SUPPORT)
PREFILLED_SYRINGE | INTRAVENOUS | Status: AC
  Filled 2013-10-14: qty 5

## 2013-10-14 MED ORDER — ONDANSETRON HCL 4 MG/2ML IJ SOLN
4.0000 mg | Freq: Four times a day (QID) | INTRAMUSCULAR | Status: DC | PRN
  Administered 2013-10-14: 4 mg via INTRAVENOUS

## 2013-10-14 MED ORDER — KETOROLAC TROMETHAMINE 30 MG/ML IJ SOLN
15.0000 mg | Freq: Once | INTRAMUSCULAR | Status: AC | PRN
  Administered 2013-10-14: 30 mg via INTRAVENOUS

## 2013-10-14 MED ORDER — DEXAMETHASONE SODIUM PHOSPHATE 10 MG/ML IJ SOLN
INTRAMUSCULAR | Status: AC
  Filled 2013-10-14: qty 1

## 2013-10-14 MED ORDER — PROPOFOL 10 MG/ML IV BOLUS
INTRAVENOUS | Status: DC | PRN
  Administered 2013-10-14: 180 mg via INTRAVENOUS

## 2013-10-14 MED ORDER — FENTANYL CITRATE 0.05 MG/ML IJ SOLN
INTRAMUSCULAR | Status: AC
  Filled 2013-10-14: qty 5

## 2013-10-14 MED ORDER — GLYCOPYRROLATE 0.2 MG/ML IJ SOLN
INTRAMUSCULAR | Status: AC
  Filled 2013-10-14: qty 3

## 2013-10-14 MED ORDER — ONDANSETRON HCL 4 MG/2ML IJ SOLN
INTRAMUSCULAR | Status: DC | PRN
Start: 2013-10-14 — End: 2013-10-14
  Administered 2013-10-14: 4 mg via INTRAVENOUS

## 2013-10-14 MED ORDER — ALPRAZOLAM 0.25 MG PO TABS
0.2500 mg | ORAL_TABLET | Freq: Three times a day (TID) | ORAL | Status: DC | PRN
  Administered 2013-10-14: 0.25 mg via ORAL
  Filled 2013-10-14: qty 1

## 2013-10-14 SURGICAL SUPPLY — 46 items
BARRIER ADHS 3X4 INTERCEED (GAUZE/BANDAGES/DRESSINGS) ×2 IMPLANT
CABLE HIGH FREQUENCY MONO STRZ (ELECTRODE) ×2 IMPLANT
CATH ROBINSON RED A/P 16FR (CATHETERS) ×2 IMPLANT
CLOTH BEACON ORANGE TIMEOUT ST (SAFETY) ×2 IMPLANT
CONT PATH 16OZ SNAP LID 3702 (MISCELLANEOUS) ×2 IMPLANT
COVER TABLE BACK 60X90 (DRAPES) ×2 IMPLANT
DECANTER SPIKE VIAL GLASS SM (MISCELLANEOUS) ×4 IMPLANT
DERMABOND ADHESIVE PROPEN (GAUZE/BANDAGES/DRESSINGS) ×1
DERMABOND ADVANCED (GAUZE/BANDAGES/DRESSINGS) ×1
DERMABOND ADVANCED .7 DNX12 (GAUZE/BANDAGES/DRESSINGS) ×1 IMPLANT
DERMABOND ADVANCED .7 DNX6 (GAUZE/BANDAGES/DRESSINGS) ×1 IMPLANT
DRSG COVADERM PLUS 2X2 (GAUZE/BANDAGES/DRESSINGS) ×4 IMPLANT
ELECT LIGASURE LONG (ELECTRODE) ×2 IMPLANT
ELECT REM PT RETURN 9FT ADLT (ELECTROSURGICAL) ×2
ELECTRODE REM PT RTRN 9FT ADLT (ELECTROSURGICAL) ×1 IMPLANT
FILTER SMOKE EVAC LAPAROSHD (FILTER) ×2 IMPLANT
GLOVE BIO SURGEON STRL SZ7.5 (GLOVE) ×6 IMPLANT
GLOVE BIOGEL PI IND STRL 6.5 (GLOVE) ×1 IMPLANT
GLOVE BIOGEL PI INDICATOR 6.5 (GLOVE) ×1
GOWN STRL REUS W/TWL LRG LVL3 (GOWN DISPOSABLE) ×8 IMPLANT
IRRIGATOR SUCTION SS 5MM (IRRIGATION / IRRIGATOR) ×2 IMPLANT
IV LACTATED RINGER IRRG 3000ML (IV SOLUTION) ×1
IV LR IRRIG 3000ML ARTHROMATIC (IV SOLUTION) ×1 IMPLANT
NEEDLE INSUFFLATION 120MM (ENDOMECHANICALS) ×2 IMPLANT
NS IRRIG 1000ML POUR BTL (IV SOLUTION) ×2 IMPLANT
PACK LAVH (CUSTOM PROCEDURE TRAY) ×2 IMPLANT
PROTECTOR NERVE ULNAR (MISCELLANEOUS) ×4 IMPLANT
SCISSORS LAP 5X45 EPIX DISP (ENDOMECHANICALS) ×2 IMPLANT
SEALER TISSUE G2 CVD JAW 45CM (ENDOMECHANICALS) ×2 IMPLANT
SET CYSTO W/LG BORE CLAMP LF (SET/KITS/TRAYS/PACK) ×2 IMPLANT
SET IRRIG TUBING LAPAROSCOPIC (IRRIGATION / IRRIGATOR) IMPLANT
SPONGE GAUZE 2X2 8PLY STRL LF (GAUZE/BANDAGES/DRESSINGS) ×4 IMPLANT
STRIP CLOSURE SKIN 1/4X3 (GAUZE/BANDAGES/DRESSINGS) IMPLANT
SUT MNCRL 0 MO-4 VIOLET 18 CR (SUTURE) ×3 IMPLANT
SUT MNCRL 0 VIOLET 6X18 (SUTURE) ×1 IMPLANT
SUT MONOCRYL 0 6X18 (SUTURE) ×1
SUT MONOCRYL 0 MO 4 18  CR/8 (SUTURE) ×3
SUT VIC AB 4-0 PS2 27 (SUTURE) ×2 IMPLANT
SUT VICRYL 0 UR6 27IN ABS (SUTURE) IMPLANT
SYR 30ML LL (SYRINGE) ×2 IMPLANT
TOWEL OR 17X24 6PK STRL BLUE (TOWEL DISPOSABLE) ×4 IMPLANT
TRAY FOLEY CATH 14FR (SET/KITS/TRAYS/PACK) ×2 IMPLANT
TROCAR XCEL NON-BLD 11X100MML (ENDOMECHANICALS) ×2 IMPLANT
TROCAR XCEL NON-BLD 5MMX100MML (ENDOMECHANICALS) ×2 IMPLANT
WARMER LAPAROSCOPE (MISCELLANEOUS) ×2 IMPLANT
WATER STERILE IRR 1000ML POUR (IV SOLUTION) ×2 IMPLANT

## 2013-10-14 NOTE — Anesthesia Preprocedure Evaluation (Signed)
Anesthesia Evaluation  Patient identified by MRN, date of birth, ID band Patient awake    Reviewed: Allergy & Precautions, H&P , NPO status , Patient's Chart, lab work & pertinent test results, reviewed documented beta blocker date and time   History of Anesthesia Complications (+) POST - OP SPINAL HEADACHE  Airway Mallampati: I TM Distance: >3 FB Neck ROM: full    Dental  (+) Teeth Intact   Pulmonary Current Smoker (1/2 ppd),  breath sounds clear to auscultation  Pulmonary exam normal       Cardiovascular Exercise Tolerance: Good Rhythm:regular Rate:Normal     Neuro/Psych negative neurological ROS  negative psych ROS   GI/Hepatic negative GI ROS, Neg liver ROS,   Endo/Other  negative endocrine ROS  Renal/GU negative Renal ROS  Female GU complaint     Musculoskeletal   Abdominal   Peds  Hematology negative hematology ROS (+)   Anesthesia Other Findings   Reproductive/Obstetrics negative OB ROS                           Anesthesia Physical Anesthesia Plan  ASA: II  Anesthesia Plan: General ETT   Post-op Pain Management:    Induction:   Airway Management Planned:   Additional Equipment:   Intra-op Plan:   Post-operative Plan:   Informed Consent: I have reviewed the patients History and Physical, chart, labs and discussed the procedure including the risks, benefits and alternatives for the proposed anesthesia with the patient or authorized representative who has indicated his/her understanding and acceptance.   Dental Advisory Given  Plan Discussed with: CRNA and Surgeon  Anesthesia Plan Comments:         Anesthesia Quick Evaluation

## 2013-10-14 NOTE — Anesthesia Postprocedure Evaluation (Signed)
Anesthesia Post Note  Patient: Kendra Hurley  Procedure(s) Performed: Procedure(s) (LRB): LAPAROSCOPIC ASSISTED VAGINAL HYSTERECTOMY BILATERAL SALPINGECTOMY,cystoscopy and lysis of adhesions (N/A)  Anesthesia type: General  Patient location: PACU  Post pain: Pain level controlled  Post assessment: Post-op Vital signs reviewed  Last Vitals:  Filed Vitals:   10/14/13 1015  BP: 99/66  Pulse: 69  Temp:   Resp: 11    Post vital signs: Reviewed  Level of consciousness: sedated  Complications: No apparent anesthesia complications

## 2013-10-14 NOTE — Progress Notes (Signed)
No changes to H&P per patient history Reviewed with patient procedure-LAVH/ bilat salpingectomy, poss USO All questions answered

## 2013-10-14 NOTE — Anesthesia Postprocedure Evaluation (Signed)
  Anesthesia Post-op Note  Patient: Kendra ApleyLindsay Hurley  Procedure(s) Performed: Procedure(s): LAPAROSCOPIC ASSISTED VAGINAL HYSTERECTOMY BILATERAL SALPINGECTOMY,cystoscopy and lysis of adhesions (N/A)  Patient Location: [facility]  Anesthesia Type:General  Level of Consciousness: awake  Airway and Oxygen Therapy: Patient Spontanous Breathing  Post-op Pain: mild  Post-op Assessment: Patient's Cardiovascular Status Stable and Respiratory Function Stable  Post-op Vital Signs: stable  Complications: No apparent anesthesia complications

## 2013-10-14 NOTE — Addendum Note (Signed)
Addendum created 10/14/13 1537 by Renford DillsJanet L Venissa Nappi, CRNA   Modules edited: Notes Section   Notes Section:  File: [id number]

## 2013-10-14 NOTE — Op Note (Signed)
NAMAlveria Apley:  Postlewait, Mikahla            ACCOUNT NO.:  [id number]  MEDICAL RECORD NO.:  [id number]  LOCATION:  9310                          FACILITY:  [facility]  PHYSICIAN:  Guy SandiferJames E. Henderson Cloudomblin, M.D. DATE OF BIRTH:  [birth year]  DATE OF PROCEDURE:  10/14/2013 DATE OF DISCHARGE:                              OPERATIVE REPORT   PREOPERATIVE DIAGNOSIS:  Menometrorrhagia.  POSTOPERATIVE DIAGNOSES: 1. Menometrorrhagia. 2. Pelvic adhesions.  PROCEDURE: 1. Laparoscopically assisted vaginal hysterectomy. 2. Bilateral salpingectomy. 3. Extensive lysis of adhesions. 4. Cystoscopy.  SURGEON:  Guy SandiferJames E. Henderson Cloudomblin, M.D.  ASSISTANT:  Herschell Dimesichard J. Marcelle OverlieHolland, M.D.  ANESTHESIA:  General endotracheal intubation.  ESTIMATED BLOOD LOSS:  150 mL.  SPECIMENS:  Uterus, bilateral fallopian tubes to pathology.  INDICATION AND CONSENT:  This patient is a 33 year old G3, P3, status post tubal ligation with heavy painful menses.  After discussion of options, she was admitted for laparoscopically assisted vaginal hysterectomy, bilateral salpingectomy, and removal of one ovary only if distinctly abnormal.  Potential risks and complications have been discussed with the patient preoperatively including but not limited to infection, organ damage, bleeding requiring transfusion of blood products with HIV and hepatitis acquisition, DVT, PE, pneumonia, fistula formation, laparotomy, return to the operating room, pelvic pain, painful intercourse.  All questions have been answered and consent was signed on the chart.  FINDINGS:  Upper abdomen is grossly normal.  There are omental adhesions to the midline anterior abdominal wall that do not contain __________. The anterior uterine fundus is densely adherent to the lower anterior uterine wall.  The distal portion of the left fallopian tube status post ligation is adherent with thin adhesions.  Right fallopian tube is also status post ligation.  DESCRIPTION OF PROCEDURE:  The  patient was taken to the operating room, where she was identified, placed in dorsal supine position, and general anesthesia was induced via endotracheal intubation.  She had been placed in the dorsal lithotomy position.  Time-out was undertaken.  She was prepped abdominally and vaginally.  Bladder straight catheterized. Hulka tenaculum was placed in the uterus system  manipulator and she was draped in a sterile fashion.  The infraumbilical and suprapubic areas were injected in midline with approximately 5 mL total of 0.5% plain Marcaine.  A small infraumbilical incision was made by carefully elevating the anterior abdominal wall.  A disposable Veress needle was then placed on the first attempt without difficulty.  A good syringe and drop test were noted.  Two liters of gas was then insufflated under low pressure with good tympany in the right upper quadrant.  Veress needle was removed and 10/11 XL bladeless disposable trocar sleeve was then placed using the diagnostic laparoscope for visualization.  After placement, the operative scope was used.  The above adhesions were noted.  The disposable Metzenbaum type scissors with unipolar cautery was then used to take down the adhesions of the omentum to the anterior abdominal wall with the point of insertion in the abdominal wall. Again, this was clear of __________ and good hemostasis was maintained. The anterior uterine fundus was then widely dissected from the anterior abdominal wall with the scissors as well.  This was carried down to the anterior portion of the  lower uterine segment.  Then using the EnSeal bipolar cautery cutting instrument, both distal portions of the fallopian tubes were removed and taken through the umbical trocar sleeve separately.  The EnSeal product was then used to take down first the mesosalpinx on the right fallopian tube, take down of the uteroovarian ligament, and the insertion of the round ligament and then this  carried down to the level of the vesicouterine peritoneum.  This was done on the left side as well.  The vesicouterine peritoneum was then taken down cephalolaterally.  Good hemostasis was noted.  A suprapubic trocar sleeve was removed.  Instruments were removed.  Attention was returned to the vagina.  Posterior cul-de-sac was sharply entered and the cervix was circumscribed with unipolar cautery.  Mucosa was advanced sharply and bluntly.  Then using the LigaSure handheld bipolar cautery, progressive bites were taken of the uterosacral ligaments, cardinal ligaments, uterine vessels bilaterally.  The anterior space was then also dissected sharply and bluntly.  After the uterine  arteries have been taken down, the fundus was delivered posteriorly.  This allowed good visualization of the remaining adhesions to the anterior low uterine segments which can safely be taken down with the LigaSure and the scissors thereby freeing the specimen.  All suture will be 0 Monocryl unless otherwise designated.  Uterosacral ligaments were plicated to the cuff bilaterally and then plicated in the midline with the third suture.  The cuff was closed with figure-of-eights. Cystoscopy was then carried out with a 70-degree cystoscope.  A 360 degree inspection revealed no foreign bodies.  No evidence of perforation and a good __________ of urine from the ureters bilaterally. Cystoscope was removed and the Foley catheter was placed.  Attention was returned to the abdomen.  Pneumoperitoneum was reintroduced and under direct visualization, a suprapubic trocar sleeve was replaced. Irrigation was carried out.  Minor bleeding in peritoneal edges was controlled with bipolar cautery.  Inspection under reduced pneumoperitoneum revealed good hemostasis.  Interceed __________ laparoscope and placed over the vaginal cuff.  The remaining approximately 20 mL of 0.5% plain Marcaine was instilled into the peritoneal cavity.  A  suprapubic trocar sleeve was removed. Pneumoperitoneum was reduced, and the umbical trocar sleeve was removed. A 0 Vicryl was used to reapproximate the subcutaneous tissue in a single stitch under good visualization, and the skin was closed on both incisions with Dermabond.  All counts were correct, and the patient was awakened and taken to recovery room in stable condition.     Guy Sandifer Henderson Cloud, M.D.     JET/MEDQ  D:  10/14/2013  T:  10/14/2013  Job:  [id number]

## 2013-10-14 NOTE — Transfer of Care (Signed)
Immediate Anesthesia Transfer of Care Note  Patient: Kendra Hurley  Procedure(s) Performed: Procedure(s): LAPAROSCOPIC ASSISTED VAGINAL HYSTERECTOMY BILATERAL SALPINGECTOMY,cystoscopy and lysis of adhesions (N/A)  Patient Location: PACU  Anesthesia Type:General  Level of Consciousness: awake, alert  and patient cooperative  Airway & Oxygen Therapy: Patient Spontanous Breathing and Patient connected to nasal cannula oxygen  Post-op Assessment: Report given to PACU RN and Post -op Vital signs reviewed and stable  Post vital signs: Reviewed and stable  Complications: No apparent anesthesia complications

## 2013-10-14 NOTE — Progress Notes (Signed)
Had a lot of bladder spasm in PACU with foley. Foley was removed. Has not voided yet.  She is hungry  Blood pressure 103/56, pulse 55, temperature 97.5 F (36.4 C), temperature source Oral, resp. rate 18, height 4\' 11"  (1.499 m), weight 123 lb (55.792 kg), SpO2 99.00%, not currently breastfeeding.  Lungs CTA Cor RRR Abd soft, flat, BS + Ext PAS on  A: Satisfactory  P: D/W patient her surgery and adhesions of uterus to bladder dome and ant abdominal wall      D/W bladder spasms and need to void      Will try to void-if unable with straight cath

## 2013-10-14 NOTE — Progress Notes (Signed)
UR completed 

## 2013-10-14 NOTE — Brief Op Note (Signed)
10/14/2013  9:20 AM  PATIENT:  Kendra Hurley  33 y.o. female  PRE-OPERATIVE DIAGNOSIS:  menorrhagia  POST-OPERATIVE DIAGNOSIS:  menorrhagia  PROCEDURE:  Procedure(s): LAPAROSCOPIC ASSISTED VAGINAL HYSTERECTOMY BILATERAL SALPINGECTOMY,cystoscopy and lysis of adhesions (N/A)  SURGEON:  Surgeon(s) and Role:    * Leslie AndreaJames E Jaton Eilers II, MD - Primary    * Meriel Picaichard M Holland, MD - Assisting  PHYSICIAN ASSISTANT:   ASSISTANTS: none   ANESTHESIA:   general  EBL:  Total I/O In: 1000 [I.V.:1000] Out: 250 [Urine:100; Blood:150]  BLOOD ADMINISTERED:none  DRAINS: Urinary Catheter (Foley)   LOCAL MEDICATIONS USED:  Amount: 20cc ml and OTHER Exaprel  SPECIMEN:  Source of Specimen:  uterus , bilateral tubes  DISPOSITION OF SPECIMEN:  PATHOLOGY  COUNTS:  YES  TOURNIQUET:  * No tourniquets in log *  DICTATION: .Other Dictation: Dictation Number  [id number]  PLAN OF CARE: Admit for overnight observation  PATIENT DISPOSITION:  PACU - hemodynamically stable.   Delay start of Pharmacological VTE agent (>24hrs) due to surgical blood loss or risk of bleeding: not applicable

## 2013-10-15 ENCOUNTER — Encounter (HOSPITAL_COMMUNITY): Payer: Self-pay | Admitting: Obstetrics and Gynecology

## 2013-10-15 LAB — CBC
HEMATOCRIT: 34.3 % — AB (ref 36.0–46.0)
HEMOGLOBIN: 11.1 g/dL — AB (ref 12.0–15.0)
MCH: 29.8 pg (ref 26.0–34.0)
MCHC: 32.4 g/dL (ref 30.0–36.0)
MCV: 92 fL (ref 78.0–100.0)
Platelets: 131 10*3/uL — ABNORMAL LOW (ref 150–400)
RBC: 3.73 MIL/uL — AB (ref 3.87–5.11)
RDW: 13.8 % (ref 11.5–15.5)
WBC: 7 10*3/uL (ref 4.0–10.5)

## 2013-10-15 MED ORDER — IBUPROFEN 600 MG PO TABS: 600.0000 mg | ORAL_TABLET | Freq: Four times a day (QID) | ORAL | Status: DC | PRN

## 2013-10-15 MED ORDER — OXYCODONE-ACETAMINOPHEN 5-325 MG PO TABS: 1.0000 | ORAL_TABLET | Freq: Four times a day (QID) | ORAL | Status: DC | PRN

## 2013-10-15 NOTE — Discharge Summary (Signed)
Physician Discharge Summary  Patient ID: Kendra Hurley MRN: [id number] DOB/AGE: 30/09/1980 30 y.o.  Admit date: 10/14/2013 Discharge date: 10/15/2013  Admission Diagnoses:Menorrhagia  Discharge Diagnoses: Menorrhagia Active Problems:   Menorrhagia   Discharged Condition: good  Hospital Course: admitted for surgery. Had bladder spasm in PACU with foley catheter. Foley was removed and patient did well with voids. Good resumption of bowel function, tolerating regular diet, passing flatus, ambulating with good pain control.  Consults: None  Significant Diagnostic Studies: labs:  Results for orders placed during the hospital encounter of 10/14/13 (from the past 24 hour(s))  CBC     Status: Abnormal   Collection Time    10/15/13  5:40 AM      Result Value Ref Range   WBC 7.0  4.0 - 10.5 K/uL   RBC 3.73 (*) 3.87 - 5.11 MIL/uL   Hemoglobin 11.1 (*) 12.0 - 15.0 g/dL   HCT 40.934.3 (*) 81.136.0 - 91.446.0 %   MCV 92.0  78.0 - 100.0 fL   MCH 29.8  26.0 - 34.0 pg   MCHC 32.4  30.0 - 36.0 g/dL   RDW 78.213.8  95.611.5 - 21.315.5 %   Platelets 131 (*) 150 - 400 K/uL    Treatments: IV hydration and surgery: LAVH with extensive lysis of adhesions and bilateral salpingectomy  Discharge Exam: Blood pressure 99/53, pulse 54, temperature 98.1 F (36.7 C), temperature source Oral, resp. rate 18, height 4\' 11"  (1.499 m), weight 123 lb (55.792 kg), SpO2 98.00%, not currently breastfeeding. General appearance: alert, cooperative and no distress GI: soft, non-tender; bowel sounds normal; no masses,  no organomegaly  Disposition: 01-Home or Self Care     Medication List         ibuprofen 600 MG tablet  Commonly known as:  ADVIL,MOTRIN  Take 1 tablet (600 mg total) by mouth every 6 (six) hours as needed (mild pain).     OVER THE COUNTER MEDICATION  Take 1 tablet by mouth 2 (two) times daily. [employer] Multivitamin Dose Pack     oxyCODONE-acetaminophen 5-325 MG per tablet  Commonly known as:  PERCOCET/ROXICET   Take 1-2 tablets by mouth every 6 (six) hours as needed for severe pain (moderate to severe pain (when tolerating fluids)).         Signed: Nohea Kras II,Deon Duer E 10/15/2013, 1:12 PM

## 2013-10-15 NOTE — Progress Notes (Signed)
Pt  Out in wheelchair   Teaching complete  

## 2013-10-15 NOTE — Progress Notes (Signed)
POD  #1 Tolerating regular diet, ambulating, voiding, + flatus, good pain relief  VSS Afeb Lungs CTA Abd soft, BS + Ext NT  Results for orders placed during the hospital encounter of 10/14/13 (from the past 24 hour(s))  CBC     Status: Abnormal   Collection Time    10/15/13  5:40 AM      Result Value Ref Range   WBC 7.0  4.0 - 10.5 K/uL   RBC 3.73 (*) 3.87 - 5.11 MIL/uL   Hemoglobin 11.1 (*) 12.0 - 15.0 g/dL   HCT 62.934.3 (*) 52.836.0 - 41.346.0 %   MCV 92.0  78.0 - 100.0 fL   MCH 29.8  26.0 - 34.0 pg   MCHC 32.4  30.0 - 36.0 g/dL   RDW 24.413.8  01.011.5 - 27.215.5 %   Platelets 131 (*) 150 - 400 K/uL    A: Satisfactory  P: D/C Home     Instructions given

## 2014-05-25 ENCOUNTER — Encounter (HOSPITAL_COMMUNITY): Payer: Self-pay | Admitting: Obstetrics and Gynecology

## 2014-11-26 ENCOUNTER — Other Ambulatory Visit (HOSPITAL_COMMUNITY): Payer: Self-pay | Admitting: Obstetrics and Gynecology

## 2014-11-27 LAB — CYTOLOGY - PAP

## 2014-12-01 ENCOUNTER — Telehealth: Payer: Self-pay | Admitting: Internal Medicine

## 2014-12-01 NOTE — Telephone Encounter (Signed)
Rec'd records from [facility] Forwarding 4pgs to Dr.Perry

## 2015-01-21 ENCOUNTER — Encounter: Payer: Self-pay | Admitting: Internal Medicine

## 2015-01-21 ENCOUNTER — Ambulatory Visit (INDEPENDENT_AMBULATORY_CARE_PROVIDER_SITE_OTHER): Payer: 59 | Admitting: Internal Medicine

## 2015-01-21 VITALS — BP 80/54 | HR 64 | Ht 59.0 in | Wt 118.6 lb

## 2015-01-21 DIAGNOSIS — K589 Irritable bowel syndrome without diarrhea: Secondary | ICD-10-CM

## 2015-01-21 DIAGNOSIS — K5901 Slow transit constipation: Secondary | ICD-10-CM

## 2015-01-21 DIAGNOSIS — R110 Nausea: Secondary | ICD-10-CM

## 2015-01-21 DIAGNOSIS — R1031 Right lower quadrant pain: Secondary | ICD-10-CM | POA: Diagnosis not present

## 2015-01-21 DIAGNOSIS — R140 Abdominal distension (gaseous): Secondary | ICD-10-CM

## 2015-01-21 MED ORDER — LINACLOTIDE 290 MCG PO CAPS: 290.0000 ug | ORAL_CAPSULE | Freq: Every day | ORAL | Status: DC

## 2015-01-21 NOTE — Patient Instructions (Signed)
We have sent the following medications to your pharmacy for you to pick up at your convenience:  Linzess    

## 2015-01-21 NOTE — Progress Notes (Signed)
HISTORY OF PRESENT ILLNESS:  Kendra Hurley is a 34 y.o. female , Teacher, English as a foreign languagelogistics coordinator, who is referred today for consultation by Dr. Harold HedgeJames Tomblin with chief complaint of constipation, abdominal pain, nausea. Patient reports several year history of problems with constipation. This has worsened with time. She reports having only 3 bowel movements per month. She has tried over-the-counter laxative's (cannot specify) which did not help. She has also tried laxative's and as needed MiraLAX without relief. Associated with constipation his abdominal discomfort and bloating. Discomfort in the abdomen is most prominent on the right side. This has been going on for years. Review of outside records shows that she had a contrast-enhanced CT scan of the abdomen and pelvis 02/21/2013 to evaluate right lower quadrant pain. Examination was normal with a nonobstructive 3 mm left kidney stone incidentally noted. The patient denies rectal bleeding or weight loss. Her paternal grandfather had colon cancer. Recent gynecology examination 11/26/2014 was unremarkable. The patient herself is status post cesarean section 3 and hysterectomy in 2005. She smokes. She exercises. Drinks water limitedly. Next, she reports waking at night with severe nausea but no vomiting. She denies pyrosis, water brash, or dysphagia.  REVIEW OF SYSTEMS:  All non-GI ROS negative upon review of all systems  Past Medical History  Diagnosis Date  . Spinal headache     otc med prn  . Cervical dysplasia     hx of high-grade  . Asthma     Past Surgical History  Procedure Laterality Date  . Cesarean section  2006 and 2008  . Cesarean section N/A 11/11/2012    Procedure: CESAREAN SECTION;  Surgeon: Leslie AndreaJames E Tomblin II, MD;  Location: [facility];  Service: Obstetrics;  Laterality: N/A;  . Tubal ligation  11/11/2012    Procedure: BILATERAL TUBAL LIGATION;  Surgeon: Leslie AndreaJames E Tomblin II, MD;  Location: [facility];  Service: Obstetrics;;  . Wisdom tooth  extraction    . Laparoscopic assisted vaginal hysterectomy N/A 10/14/2013    Procedure: LAPAROSCOPIC ASSISTED VAGINAL HYSTERECTOMY BILATERAL SALPINGECTOMY,cystoscopy and lysis of adhesions;  Surgeon: Leslie AndreaJames E Tomblin II, MD;  Location: [facility];  Service: Gynecology;  Laterality: N/A;    Social History Kendra Hurley  reports that she has been smoking Cigarettes.  She has a 7.5 pack-year smoking history. She has never used smokeless tobacco. She reports that she drinks alcohol. She reports that she does not use illicit drugs.  family history includes Colitis in her paternal grandfather; Irritable bowel syndrome in her paternal grandfather and sister; Ovarian cancer in her paternal grandmother.  No Known Allergies     PHYSICAL EXAMINATION: Vital signs: BP 80/54 mmHg  Pulse 64  Ht 4\' 11"  (1.499 m)  Wt 118 lb 9.6 oz (53.797 kg)  BMI 23.94 kg/m2  LMP 09/22/2013  Constitutional: generally well-appearing, no acute distress Psychiatric: alert and oriented x3, cooperative Eyes: extraocular movements intact, anicteric, conjunctiva pink Mouth: oral pharynx moist, no lesions Neck: supple no lymphadenopathy Cardiovascular: heart regular rate and rhythm, no murmur Lungs: clear to auscultation bilaterally Abdomen: soft, nontender, nondistended, no obvious ascites, no peritoneal signs, normal bowel sounds, no organomegaly Rectal: Omitted Extremities: no lower extremity edema bilaterally Skin: no lesions on visible extremities Neuro: No focal deficits.  ASSESSMENT:  #1. Constipation predominant IBS. #2. Isolated nausea   PLAN:\  #1. Prescribe Linzess 290 g daily #2. If Linzess unhelpful, recommended MiraLAX DAILY. Titrated to need #3. GI office follow-up in 4 weeks. Should symptoms persist despite above, Could consider enrollment in constipation predominant IBS  trial if still ongoing and patient interested.  A copy of this consultation has been sent to Dr. Henderson Cloud

## 2015-01-22 ENCOUNTER — Telehealth: Payer: Self-pay | Admitting: Internal Medicine

## 2015-01-22 NOTE — Telephone Encounter (Signed)
Submitted prior authorization for Linzess 290 through Cover my Meds.  Awaiting response

## 2015-01-26 NOTE — Telephone Encounter (Signed)
Linzess prior authorization approved

## 2015-03-09 ENCOUNTER — Ambulatory Visit: Payer: 59 | Admitting: Internal Medicine

## 2015-06-14 ENCOUNTER — Encounter (HOSPITAL_BASED_OUTPATIENT_CLINIC_OR_DEPARTMENT_OTHER): Payer: Self-pay | Admitting: Emergency Medicine

## 2015-06-14 ENCOUNTER — Emergency Department (HOSPITAL_BASED_OUTPATIENT_CLINIC_OR_DEPARTMENT_OTHER)
Admission: EM | Admit: 2015-06-14 | Discharge: 2015-06-14 | Disposition: A | Discharge status: Home / Self Care | Payer: 59 | Attending: Emergency Medicine | Admitting: Emergency Medicine

## 2015-06-14 DIAGNOSIS — J45909 Unspecified asthma, uncomplicated: Secondary | ICD-10-CM | POA: Diagnosis not present

## 2015-06-14 DIAGNOSIS — F17210 Nicotine dependence, cigarettes, uncomplicated: Secondary | ICD-10-CM | POA: Insufficient documentation

## 2015-06-14 DIAGNOSIS — K589 Irritable bowel syndrome without diarrhea: Secondary | ICD-10-CM | POA: Insufficient documentation

## 2015-06-14 DIAGNOSIS — M545 Low back pain, unspecified: Secondary | ICD-10-CM

## 2015-06-14 DIAGNOSIS — S3992XA Unspecified injury of lower back, initial encounter: Secondary | ICD-10-CM | POA: Insufficient documentation

## 2015-06-14 DIAGNOSIS — Y998 Other external cause status: Secondary | ICD-10-CM | POA: Insufficient documentation

## 2015-06-14 DIAGNOSIS — Z87448 Personal history of other diseases of urinary system: Secondary | ICD-10-CM | POA: Insufficient documentation

## 2015-06-14 DIAGNOSIS — Z8669 Personal history of other diseases of the nervous system and sense organs: Secondary | ICD-10-CM | POA: Diagnosis not present

## 2015-06-14 DIAGNOSIS — Z79899 Other long term (current) drug therapy: Secondary | ICD-10-CM | POA: Diagnosis not present

## 2015-06-14 DIAGNOSIS — Y9289 Other specified places as the place of occurrence of the external cause: Secondary | ICD-10-CM | POA: Diagnosis not present

## 2015-06-14 DIAGNOSIS — X58XXXA Exposure to other specified factors, initial encounter: Secondary | ICD-10-CM | POA: Insufficient documentation

## 2015-06-14 DIAGNOSIS — Y9389 Activity, other specified: Secondary | ICD-10-CM | POA: Insufficient documentation

## 2015-06-14 MED ORDER — METHOCARBAMOL 500 MG PO TABS
1000.0000 mg | ORAL_TABLET | Freq: Once | ORAL | Status: AC
  Administered 2015-06-14: 1000 mg via ORAL
  Filled 2015-06-14: qty 2

## 2015-06-14 MED ORDER — MELOXICAM 7.5 MG PO TABS: 7.5000 mg | ORAL_TABLET | Freq: Every day | ORAL | Status: DC

## 2015-06-14 MED ORDER — METHOCARBAMOL 500 MG PO TABS: 500.0000 mg | ORAL_TABLET | Freq: Four times a day (QID) | ORAL | Status: DC | PRN

## 2015-06-14 NOTE — ED Provider Notes (Signed)
CSN: [id number]     Arrival date & time 06/14/15  1903 History   First MD Initiated Contact with Patient 06/14/15 1918     Chief Complaint  Patient presents with  . Back Pain     (Consider location/radiation/quality/duration/timing/severity/associated sxs/prior Treatment) The history is provided by the patient.   Patient presents with low back pain that began tonight after she bent over to pick something up off the floor and developed a sudden pain and got stuck in the position of being bent over.  The pain is located across the entire lower back.  Denies any radiation of the pain.  States she has had 3 falls down the stairs at her house this year, last one was one month ago.  The stairs are steep and she carries laundry baskets up and down them - states this is the reason for her falls.  Denies fevers, chills, abdominal pain, loss of control of bowel or bladder, weakness of numbness of the extremities, saddle anesthesia, bowel, urinary, or vaginal complaints.  Took ibuprofen before coming to the ED.    Past Medical History  Diagnosis Date  . Spinal headache     otc med prn  . Cervical dysplasia     hx of high-grade  . Asthma   . IBS (irritable bowel syndrome)    Past Surgical History  Procedure Laterality Date  . Cesarean section  2006 and 2008  . Cesarean section N/A 11/11/2012    Procedure: CESAREAN SECTION;  Surgeon: Leslie AndreaJames E Tomblin II, MD;  Location: [facility];  Service: Obstetrics;  Laterality: N/A;  . Tubal ligation  11/11/2012    Procedure: BILATERAL TUBAL LIGATION;  Surgeon: Leslie AndreaJames E Tomblin II, MD;  Location: [facility];  Service: Obstetrics;;  . Wisdom tooth extraction    . Laparoscopic assisted vaginal hysterectomy N/A 10/14/2013    Procedure: LAPAROSCOPIC ASSISTED VAGINAL HYSTERECTOMY BILATERAL SALPINGECTOMY,cystoscopy and lysis of adhesions;  Surgeon: Leslie AndreaJames E Tomblin II, MD;  Location: [facility];  Service: Gynecology;  Laterality: N/A;   Family History  Problem Relation Age of  Onset  . Irritable bowel syndrome Sister   . Ovarian cancer Paternal Grandmother   . Colitis Paternal Grandfather   . Irritable bowel syndrome Paternal Grandfather    Social History  Substance Use Topics  . Smoking status: Current Every Day Smoker -- 0.50 packs/day for 15 years    Types: Cigarettes  . Smokeless tobacco: Never Used  . Alcohol Use: 0.0 oz/week    0 Standard drinks or equivalent per week     Comment: social   OB History    Gravida Para Term Preterm AB TAB SAB Ectopic Multiple Living   3 3 2  0 0     3     Review of Systems  Constitutional: Negative for fever.  Gastrointestinal: Negative for nausea, vomiting, abdominal pain, diarrhea, constipation and blood in stool.  Genitourinary: Negative for dysuria, urgency, frequency, vaginal bleeding and vaginal discharge.  Musculoskeletal: Positive for back pain. Negative for neck pain.  Skin: Negative for rash.  Allergic/Immunologic: Negative for immunocompromised state.  Neurological: Negative for weakness and numbness.  Hematological: Does not bruise/bleed easily.  Psychiatric/Behavioral: Negative for self-injury.      Allergies  Review of patient's allergies indicates no known allergies.  Home Medications   Prior to Admission medications   Medication Sig Start Date End Date Taking? Authorizing Provider  Linaclotide (LINZESS) 290 MCG CAPS capsule Take 1 capsule (290 mcg total) by mouth daily. 01/21/15  Hilarie Fredrickson, MD  Linaclotide [facility]) 290 MCG CAPS capsule Take 1 capsule (290 mcg total) by mouth daily. 01/21/15   Hilarie Fredrickson, MD   BP 97/69 mmHg  Pulse 74  Temp(Src) 97.5 F (36.4 C) (Oral)  Resp 18  Ht 5' (1.524 m)  Wt 54.432 kg  BMI 23.44 kg/m2  SpO2 100%  LMP 09/22/2013 Physical Exam  Constitutional: She appears well-developed and well-nourished. No distress.  HENT:  Head: Normocephalic and atraumatic.  Neck: Neck supple.  Pulmonary/Chest: Effort normal.  Abdominal: Soft. She exhibits no  distension and no mass. There is no tenderness. There is no rebound and no guarding.  Musculoskeletal:  Spine nontender, no crepitus, or stepoffs. Area across lower back palpated, pt states "I don't know whether that hurts or feels good" Lower extremities:  Moves legs around easily and raises them to the stretcher from the floor, declines participation in strength testing. sensation intact.   Straight leg raise negative.   Neurological: She is alert.  Skin: She is not diaphoretic.  Nursing note and vitals reviewed.   ED Course  Procedures (including critical care time) Labs Review Labs Reviewed - No data to display  Imaging Review No results found. I have personally reviewed and evaluated these images and lab results as part of my medical decision-making.   EKG Interpretation None      MDM   Final diagnoses:  Bilateral low back pain without sciatica   Afebrile, nontoxic patient with mechanical low back pain. No red flags.  No trauma.  No need for emergent imaging at this time.  D/C with mobic and robaxin. PCP follow up. Discussed result, findings, treatment, and follow up  with patient.  Pt given return precautions.  Pt verbalizes understanding and agrees with plan.        Trixie Dredge, PA-C 06/15/15 1610  Mirian Mo, MD 06/16/15 [id number]

## 2015-06-14 NOTE — ED Notes (Signed)
Patient states that she fell down her stairs about 2 - 3 weeks ago. The patient reports that she bent over today and she is now has back pain - lower back

## 2015-06-14 NOTE — Discharge Instructions (Signed)
Read the information below.  Use the prescribed medication as directed.  Please discuss all new medications with your pharmacist.  You may return to the Emergency Department at any time for worsening condition or any new symptoms that concern you.   If you develop fevers, loss of control of bowel or bladder, weakness or numbness in your legs, or are unable to walk, return to the ER for a recheck.   Please follow up with your primary care provider.  Do not take any additional ibuprofen or other NSAIDs while taking the prescribed medication meloxicam.    Back Pain, Adult Back pain is very common in adults.The cause of back pain is rarely dangerous and the pain often gets better over time.The cause of your back pain may not be known. Some common causes of back pain include:  Strain of the muscles or ligaments supporting the spine.  Wear and tear (degeneration) of the spinal disks.  Arthritis.  Direct injury to the back. For many people, back pain may return. Since back pain is rarely dangerous, most people can learn to manage this condition on their own. HOME CARE INSTRUCTIONS Watch your back pain for any changes. The following actions may help to lessen any discomfort you are feeling:  Remain active. It is stressful on your back to sit or stand in one place for long periods of time. Do not sit, drive, or stand in one place for more than 30 minutes at a time. Take short walks on even surfaces as soon as you are able.Try to increase the length of time you walk each day.  Exercise regularly as directed by your health care provider. Exercise helps your back heal faster. It also helps avoid future injury by keeping your muscles strong and flexible.  Do not stay in bed.Resting more than 1-2 days can delay your recovery.  Pay attention to your body when you bend and lift. The most comfortable positions are those that put less stress on your recovering back. Always use proper lifting techniques,  including:  Bending your knees.  Keeping the load close to your body.  Avoiding twisting.  Find a comfortable position to sleep. Use a firm mattress and lie on your side with your knees slightly bent. If you lie on your back, put a pillow under your knees.  Avoid feeling anxious or stressed.Stress increases muscle tension and can worsen back pain.It is important to recognize when you are anxious or stressed and learn ways to manage it, such as with exercise.  Take medicines only as directed by your health care provider. Over-the-counter medicines to reduce pain and inflammation are often the most helpful.Your health care provider may prescribe muscle relaxant drugs.These medicines help dull your pain so you can more quickly return to your normal activities and healthy exercise.  Apply ice to the injured area:  Put ice in a plastic bag.  Place a towel between your skin and the bag.  Leave the ice on for 20 minutes, 2-3 times a day for the first 2-3 days. After that, ice and heat may be alternated to reduce pain and spasms.  Maintain a healthy weight. Excess weight puts extra stress on your back and makes it difficult to maintain good posture. SEEK MEDICAL CARE IF:  You have pain that is not relieved with rest or medicine.  You have increasing pain going down into the legs or buttocks.  You have pain that does not improve in one week.  You have night pain.  You lose weight.  You have a fever or chills. SEEK IMMEDIATE MEDICAL CARE IF:   You develop new bowel or bladder control problems.  You have unusual weakness or numbness in your arms or legs.  You develop nausea or vomiting.  You develop abdominal pain.  You feel faint.   This information is not intended to replace advice given to you by your health care provider. Make sure you discuss any questions you have with your health care provider.   Document Released: 07/10/2005 Document Revised: 07/31/2014 Document  Reviewed: 11/11/2013 Elsevier Interactive Patient Education 2016 [place] Injury Prevention Back injuries can be very painful. They can also be difficult to heal. After having one back injury, you are more likely to injure your back again. It is important to learn how to avoid injuring or re-injuring your back. The following tips can help you to prevent a back injury. WHAT SHOULD I KNOW ABOUT PHYSICAL FITNESS?  Exercise for 30 minutes per day on most days of the week or as directed by your health care provider. Make sure to:  Do aerobic exercises, such as walking, jogging, biking, or swimming.  Do exercises that increase balance and strength, such as tai chi and yoga. These can decrease your risk of falling and injuring your back.  Do stretching exercises to help with flexibility.  Try to develop strong abdominal muscles. Your abdominal muscles provide a lot of the support that is needed by your back.  Maintain a healthy weight. This helps to decrease your risk of a back injury. WHAT SHOULD I KNOW ABOUT MY DIET?  Talk with your health care provider about your overall diet. Take supplements and vitamins only as directed by your health care provider.  Talk with your health care provider about how much calcium and vitamin D you need each day. These nutrients help to prevent weakening of the bones (osteoporosis). Osteoporosis can cause broken (fractured) bones, which lead to back pain.  Include good sources of calcium in your diet, such as dairy products, green leafy vegetables, and products that have had calcium added to them (fortified).  Include good sources of vitamin D in your diet, such as milk and foods that are fortified with vitamin D. WHAT SHOULD I KNOW ABOUT MY POSTURE?  Sit up straight and stand up straight. Avoid leaning forward when you sit or hunching over when you stand.  Choose chairs that have good low-back (lumbar) support.  If you work at a desk, sit  close to it so you do not need to lean over. Keep your chin tucked in. Keep your neck drawn back, and keep your elbows bent at a right angle. Your arms should look like the letter "L."  Sit high and close to the steering wheel when you drive. Add a lumbar support to your car seat, if needed.  Avoid sitting or standing in one position for very long. Take breaks to get up, stretch, and walk around at least one time every hour. Take breaks every hour if you are driving for long periods of time.  Sleep on your side with your knees slightly bent, or sleep on your back with a pillow under your knees. Do not lie on the front of your body to sleep. WHAT SHOULD I KNOW ABOUT LIFTING, TWISTING, AND REACHING? Lifting and Heavy Lifting  Avoid heavy lifting, especially repetitive heavy lifting. If you must do heavy lifting:  Stretch before lifting.  Work slowly.  Rest between lifts.  Use a tool such as a cart or a dolly to move objects if one is available.  Make several small trips instead of carrying one heavy load.  Ask for help when you need it, especially when moving big objects.  Follow these steps when lifting:  Stand with your feet shoulder-width apart.  Get as close to the object as you can. Do not try to pick up a heavy object that is far from your body.  Use handles or lifting straps if they are available.  Bend at your knees. Squat down, but keep your heels off the floor.  Keep your shoulders pulled back, your chin tucked in, and your back straight.  Lift the object slowly while you tighten the muscles in your legs, abdomen, and buttocks. Keep the object as close to the center of your body as possible.  Follow these steps when putting down a heavy load:  Stand with your feet shoulder-width apart.  Lower the object slowly while you tighten the muscles in your legs, abdomen, and buttocks. Keep the object as close to the center of your body as possible.  Keep your shoulders  pulled back, your chin tucked in, and your back straight.  Bend at your knees. Squat down, but keep your heels off the floor.  Use handles or lifting straps if they are available. Twisting and Reaching  Avoid lifting heavy objects above your waist.  Do not twist at your waist while you are lifting or carrying a load. If you need to turn, move your feet.  Do not bend over without bending at your knees.  Avoid reaching over your head, across a table, or for an object on a high surface. WHAT ARE SOME OTHER TIPS?  Avoid wet floors and icy ground. Keep sidewalks clear of ice to prevent falls.  Do not sleep on a mattress that is too soft or too hard.  Keep items that are used frequently within easy reach.  Put heavier objects on shelves at waist level, and put lighter objects on lower or higher shelves.  Find ways to decrease your stress, such as exercise, massage, or relaxation techniques. Stress can build up in your muscles. Tense muscles are more vulnerable to injury.  Talk with your health care provider if you feel anxious or depressed. These conditions can make back pain worse.  Wear flat heel shoes with cushioned soles.  Avoid sudden movements.  Use both shoulder straps when carrying a backpack.  Do not use any tobacco products, including cigarettes, chewing tobacco, or electronic cigarettes. If you need help quitting, ask your health care provider.   This information is not intended to replace advice given to you by your health care provider. Make sure you discuss any questions you have with your health care provider.   Document Released: 08/17/2004 Document Revised: 11/24/2014 Document Reviewed: 07/14/2014 Elsevier Interactive Patient Education [employer].

## 2016-05-15 ENCOUNTER — Other Ambulatory Visit: Payer: Self-pay | Admitting: Obstetrics and Gynecology

## 2016-05-15 DIAGNOSIS — N6310 Unspecified lump in the right breast, unspecified quadrant: Secondary | ICD-10-CM

## 2016-05-16 ENCOUNTER — Ambulatory Visit
Admission: RE | Admit: 2016-05-16 | Discharge: 2016-05-16 | Disposition: A | Discharge status: Home / Self Care | Payer: No Typology Code available for payment source | Source: Ambulatory Visit | Attending: Obstetrics and Gynecology | Admitting: Obstetrics and Gynecology

## 2016-05-16 DIAGNOSIS — N6310 Unspecified lump in the right breast, unspecified quadrant: Secondary | ICD-10-CM

## 2018-03-18 DIAGNOSIS — M1990 Unspecified osteoarthritis, unspecified site: Secondary | ICD-10-CM | POA: Diagnosis not present

## 2018-03-18 DIAGNOSIS — Z6822 Body mass index (BMI) 22.0-22.9, adult: Secondary | ICD-10-CM | POA: Diagnosis not present

## 2018-03-18 DIAGNOSIS — M797 Fibromyalgia: Secondary | ICD-10-CM | POA: Diagnosis not present

## 2018-04-30 DIAGNOSIS — R300 Dysuria: Secondary | ICD-10-CM | POA: Diagnosis not present

## 2018-04-30 DIAGNOSIS — N390 Urinary tract infection, site not specified: Secondary | ICD-10-CM | POA: Diagnosis not present

## 2018-04-30 DIAGNOSIS — M797 Fibromyalgia: Secondary | ICD-10-CM | POA: Diagnosis not present

## 2018-05-01 ENCOUNTER — Other Ambulatory Visit: Payer: Self-pay

## 2018-05-01 ENCOUNTER — Emergency Department (HOSPITAL_BASED_OUTPATIENT_CLINIC_OR_DEPARTMENT_OTHER)
Admission: EM | Admit: 2018-05-01 | Discharge: 2018-05-01 | Disposition: A | Discharge status: Home / Self Care | Payer: No Typology Code available for payment source | Attending: Emergency Medicine | Admitting: Emergency Medicine

## 2018-05-01 ENCOUNTER — Encounter (HOSPITAL_BASED_OUTPATIENT_CLINIC_OR_DEPARTMENT_OTHER): Payer: Self-pay | Admitting: Emergency Medicine

## 2018-05-01 ENCOUNTER — Emergency Department (HOSPITAL_BASED_OUTPATIENT_CLINIC_OR_DEPARTMENT_OTHER): Payer: No Typology Code available for payment source

## 2018-05-01 DIAGNOSIS — N200 Calculus of kidney: Secondary | ICD-10-CM | POA: Insufficient documentation

## 2018-05-01 DIAGNOSIS — R1032 Left lower quadrant pain: Secondary | ICD-10-CM | POA: Insufficient documentation

## 2018-05-01 DIAGNOSIS — F17210 Nicotine dependence, cigarettes, uncomplicated: Secondary | ICD-10-CM | POA: Insufficient documentation

## 2018-05-01 DIAGNOSIS — N3001 Acute cystitis with hematuria: Secondary | ICD-10-CM | POA: Insufficient documentation

## 2018-05-01 DIAGNOSIS — J45909 Unspecified asthma, uncomplicated: Secondary | ICD-10-CM | POA: Insufficient documentation

## 2018-05-01 LAB — BASIC METABOLIC PANEL
Anion gap: 8 (ref 5–15)
BUN: 14 mg/dL (ref 6–20)
CHLORIDE: 103 mmol/L (ref 98–111)
CO2: 26 mmol/L (ref 22–32)
Calcium: 9.1 mg/dL (ref 8.9–10.3)
Creatinine, Ser: 0.79 mg/dL (ref 0.44–1.00)
GFR calc Af Amer: >60 mL/min (ref >60)
GFR calc non Af Amer: >60 mL/min (ref >60)
GLUCOSE: 105 mg/dL — AB (ref 70–99)
Potassium: 4.3 mmol/L (ref 3.5–5.1)
Sodium: 137 mmol/L (ref 135–145)

## 2018-05-01 LAB — URINALYSIS, ROUTINE W REFLEX MICROSCOPIC
Bilirubin Urine: NEGATIVE
GLUCOSE, UA: NEGATIVE mg/dL
Ketones, ur: NEGATIVE mg/dL
Leukocytes, UA: NEGATIVE
Nitrite: NEGATIVE
Protein, ur: NEGATIVE mg/dL
Specific Gravity, Urine: 1.01 (ref 1.005–1.030)
pH: 8.5 — ABNORMAL HIGH (ref 5.0–8.0)

## 2018-05-01 LAB — CBC
HCT: 45.8 % (ref 36.0–46.0)
HEMOGLOBIN: 15 g/dL (ref 12.0–15.0)
MCH: 31.2 pg (ref 26.0–34.0)
MCHC: 32.8 g/dL (ref 30.0–36.0)
MCV: 95.2 fL (ref 80.0–100.0)
Platelets: 164 10*3/uL (ref 150–400)
RBC: 4.81 MIL/uL (ref 3.87–5.11)
RDW: 12.7 % (ref 11.5–15.5)
WBC: 6.7 10*3/uL (ref 4.0–10.5)
nRBC: 0 % (ref 0.0–0.2)

## 2018-05-01 LAB — HEPATIC FUNCTION PANEL
ALT: 20 U/L (ref 0–44)
AST: 21 U/L (ref 15–41)
Albumin: 4.2 g/dL (ref 3.5–5.0)
Alkaline Phosphatase: 52 U/L (ref 38–126)
BILIRUBIN INDIRECT: 0.7 mg/dL (ref 0.3–0.9)
Bilirubin, Direct: 0.1 mg/dL (ref 0.0–0.2)
TOTAL PROTEIN: 6.9 g/dL (ref 6.5–8.1)
Total Bilirubin: 0.8 mg/dL (ref 0.3–1.2)

## 2018-05-01 LAB — URINALYSIS, MICROSCOPIC (REFLEX)

## 2018-05-01 LAB — LIPASE, BLOOD: Lipase: 27 U/L (ref 11–51)

## 2018-05-01 MED ORDER — OXYCODONE-ACETAMINOPHEN 5-325 MG PO TABS: 1.0000 | ORAL_TABLET | ORAL | 0 refills | Status: DC | PRN

## 2018-05-01 MED ORDER — SODIUM CHLORIDE 0.9 % IV BOLUS
1000.0000 mL | Freq: Once | INTRAVENOUS | Status: AC
Start: 2018-05-01 — End: 2018-05-01
  Administered 2018-05-01: 1000 mL via INTRAVENOUS

## 2018-05-01 MED ORDER — ONDANSETRON HCL 4 MG/2ML IJ SOLN
4.0000 mg | Freq: Once | INTRAMUSCULAR | Status: AC | PRN
  Administered 2018-05-01: 4 mg via INTRAVENOUS
  Filled 2018-05-01: qty 2

## 2018-05-01 MED ORDER — LEVOFLOXACIN 750 MG PO TABS: 750.0000 mg | ORAL_TABLET | Freq: Every day | ORAL | 0 refills | Status: AC

## 2018-05-01 MED ORDER — MORPHINE SULFATE (PF) 4 MG/ML IV SOLN: 4.0000 mg | Freq: Once | INTRAVENOUS | Status: DC

## 2018-05-01 MED ORDER — ONDANSETRON HCL 4 MG/2ML IJ SOLN: 4.0000 mg | Freq: Once | INTRAMUSCULAR | Status: DC

## 2018-05-01 MED ORDER — ONDANSETRON HCL 4 MG PO TABS: 4.0000 mg | ORAL_TABLET | Freq: Three times a day (TID) | ORAL | 0 refills | Status: DC | PRN

## 2018-05-01 MED ORDER — KETOROLAC TROMETHAMINE 30 MG/ML IJ SOLN
30.0000 mg | Freq: Once | INTRAMUSCULAR | Status: AC
  Administered 2018-05-01: 30 mg via INTRAVENOUS
  Filled 2018-05-01: qty 1

## 2018-05-01 NOTE — ED Notes (Signed)
ED Provider at bedside. 

## 2018-05-01 NOTE — ED Provider Notes (Signed)
[facility] EMERGENCY DEPARTMENT Provider Note   CSN: [id number] Arrival date & time: 05/01/18  0806     History   Chief Complaint Chief Complaint  Patient presents with  . Flank Pain    HPI Kendra Hurley is a 37 y.o. female.  The history is provided by the patient and medical records. No language interpreter was used.  Flank Pain  This is a new problem. The current episode started more than 2 days ago. The problem occurs constantly. The problem has not changed since onset.Associated symptoms include abdominal pain. Pertinent negatives include no chest pain, no headaches and no shortness of breath. The symptoms are aggravated by twisting. Nothing relieves the symptoms. She has tried nothing for the symptoms. The treatment provided no relief.    Past Medical History:  Diagnosis Date  . Asthma   . Cervical dysplasia    hx of high-grade  . IBS (irritable bowel syndrome)   . Spinal headache    otc med prn    Patient Active Problem List   Diagnosis Date Noted  . Menorrhagia 10/14/2013  . Vertigo 08/31/2011  . Nausea 08/31/2011  . Headache(784.0) 08/31/2011    Past Surgical History:  Procedure Laterality Date  . CESAREAN SECTION  2006 and 2008  . CESAREAN SECTION N/A 11/11/2012   Procedure: CESAREAN SECTION;  Surgeon: Leslie Andrea, MD;  Location: [facility];  Service: Obstetrics;  Laterality: N/A;  . LAPAROSCOPIC ASSISTED VAGINAL HYSTERECTOMY N/A 10/14/2013   Procedure: LAPAROSCOPIC ASSISTED VAGINAL HYSTERECTOMY BILATERAL SALPINGECTOMY,cystoscopy and lysis of adhesions;  Surgeon: Leslie Andrea, MD;  Location: [facility];  Service: Gynecology;  Laterality: N/A;  . TUBAL LIGATION  11/11/2012   Procedure: BILATERAL TUBAL LIGATION;  Surgeon: Leslie Andrea, MD;  Location: [facility];  Service: Obstetrics;;  . WISDOM TOOTH EXTRACTION       OB History    Gravida  3   Para  3   Term  2   Preterm  0   AB  0   Living  3     SAB      TAB      Ectopic      Multiple      Live Births  1            Home Medications    Prior to Admission medications   Not on File    Family History Family History  Problem Relation Age of Onset  . Irritable bowel syndrome Sister   . Ovarian cancer Paternal Grandmother   . Colitis Paternal Grandfather   . Irritable bowel syndrome Paternal Grandfather     Social History Social History   Tobacco Use  . Smoking status: Current Every Day Smoker    Packs/day: 0.50    Years: 15.00    Pack years: 7.50    Types: Cigarettes  . Smokeless tobacco: Never Used  Substance Use Topics  . Alcohol use: Yes    Alcohol/week: 0.0 standard drinks    Comment: social  . Drug use: No     Allergies   Patient has no known allergies.   Review of Systems Review of Systems  Constitutional: Positive for chills. Negative for diaphoresis, fatigue and fever.  HENT: Negative for congestion.   Respiratory: Negative for chest tightness, shortness of breath, wheezing and stridor.   Cardiovascular: Negative for chest pain and palpitations.  Gastrointestinal: Positive for abdominal pain, nausea and vomiting. Negative for constipation and diarrhea.  Genitourinary: Positive for  dysuria and flank pain. Negative for decreased urine volume, difficulty urinating, frequency, pelvic pain, vaginal bleeding, vaginal discharge and vaginal pain.  Musculoskeletal: Positive for back pain. Negative for neck pain and neck stiffness.  Skin: Negative for rash and wound.  Neurological: Negative for seizures, light-headedness and headaches.  Psychiatric/Behavioral: Negative for agitation.  All other systems reviewed and are negative.    Physical Exam Updated Vital Signs BP 108/87 (BP Location: Right Arm)   Pulse 86   Temp 97.9 F (36.6 C) (Oral)   Resp 16   Ht 4\' 11"  (1.499 m)   Wt 49 kg   LMP 09/22/2013   SpO2 99%   BMI 21.81 kg/m   Physical Exam  Constitutional: She is oriented to person, place, and time.  She appears well-developed and well-nourished. No distress.  HENT:  Head: Normocephalic and atraumatic.  Right Ear: External ear normal.  Left Ear: External ear normal.  Nose: Nose normal.  Mouth/Throat: Oropharynx is clear and moist. No oropharyngeal exudate.  Eyes: Pupils are equal, round, and reactive to light. Conjunctivae and EOM are normal.  Neck: Normal range of motion. Neck supple.  Cardiovascular: Normal rate and intact distal pulses.  No murmur heard. Pulmonary/Chest: Effort normal. No stridor. No respiratory distress. She has no wheezes. She has no rales. She exhibits no tenderness.  Abdominal: Soft. Normal appearance. She exhibits no distension. There is no tenderness. There is CVA tenderness. There is no rigidity, no rebound and no guarding.    Musculoskeletal:       Lumbar back: She exhibits tenderness and pain.       Back:  Neurological: She is alert and oriented to person, place, and time. She has normal reflexes. No sensory deficit. She exhibits normal muscle tone. Coordination normal.  Skin: Skin is warm. Capillary refill takes less than 2 seconds. No rash noted. She is not diaphoretic. No erythema.  Psychiatric: She has a normal mood and affect.  Nursing note and vitals reviewed.    ED Treatments / Results  Labs (all labs ordered are listed, but only abnormal results are displayed) Labs Reviewed  URINALYSIS, ROUTINE W REFLEX MICROSCOPIC - Abnormal; Notable for the following components:      Result Value   APPearance CLOUDY (*)    pH 8.5 (*)    Hgb urine dipstick TRACE (*)    All other components within normal limits  BASIC METABOLIC PANEL - Abnormal; Notable for the following components:   Glucose, Bld 105 (*)    All other components within normal limits  URINALYSIS, MICROSCOPIC (REFLEX) - Abnormal; Notable for the following components:   Bacteria, UA RARE (*)    All other components within normal limits  CBC  HEPATIC FUNCTION PANEL  LIPASE, BLOOD     EKG None  Radiology Ct Renal Stone Study  Result Date: 05/01/2018 CLINICAL DATA:  Bilateral flank pain since Sunday. EXAM: CT ABDOMEN AND PELVIS WITHOUT CONTRAST TECHNIQUE: Multidetector CT imaging of the abdomen and pelvis was performed following the standard protocol without IV contrast. COMPARISON:  CT scan 02/21/2013 FINDINGS: Lower chest: The lung bases are clear of acute process. No pleural effusion or pulmonary lesions. The heart is normal in size. No pericardial effusion. The distal esophagus and aorta are unremarkable. Hepatobiliary: No focal hepatic lesions or intrahepatic biliary dilatation. The gallbladder appears normal. No common bile duct dilatation. Pancreas: No mass, inflammation or ductal dilatation. Spleen: Normal size.  No focal lesions. Adrenals/Urinary Tract: The adrenal glands are unremarkable. Small lower pole right  renal calculus is noted. No definite left-sided renal calculi. There is mild right-sided hydroureteronephrosis but no obstructing ureteral calculus. I suspect the patient recently passed a stone on the right side. No left-sided hydronephrosis or ureteral dilatation. No worrisome renal lesions. No bladder lesions. There is 1 small dot of air in the dome of the bladder. Stomach/Bowel: The stomach, duodenum, small bowel and colon are grossly normal without oral contrast. No inflammatory changes, mass lesions or obstructive findings. The terminal ileum and appendix are normal. Vascular/Lymphatic: The aorta is normal in caliber. No atheroscerlotic calcifications. No mesenteric of retroperitoneal mass or adenopathy. Small scattered lymph nodes are noted. Reproductive: The uterus is surgically absent. Both ovaries are still present and appear normal. Other: There is a right para vaginal cyst noted. This is likely a Bartholin's gland cyst. Musculoskeletal: No significant bony findings. IMPRESSION: 1. Mild right-sided hydroureteronephrosis but no obstructing ureteral calculus.  I suspect the patient recently passed a stone. 2. Small lower pole right renal calculus. 3. No worrisome renal or bladder lesions. 4. Status post hysterectomy. Both ovaries are still present and appear normal. The appendix is also normal. 5. Incidental right-sided Bartholin's gland cyst. Electronically Signed   By: Rudie Meyer M.D.   On: 05/01/2018 10:17    Procedures Procedures (including critical care time)  Medications Ordered in ED Medications  morphine 4 MG/ML injection 4 mg (4 mg Intravenous Not Given 05/01/18 0932)  ondansetron (ZOFRAN) injection 4 mg (has no administration in time range)  ondansetron (ZOFRAN) injection 4 mg (4 mg Intravenous Given 05/01/18 0852)  ketorolac (TORADOL) 30 MG/ML injection 30 mg (30 mg Intravenous Given 05/01/18 0933)  sodium chloride 0.9 % bolus 1,000 mL ( Intravenous Stopped 05/01/18 1047)     Initial Impression / Assessment and Plan / ED Course  I have reviewed the triage vital signs and the nursing notes.  Pertinent labs & imaging results that were available during my care of the patient were reviewed by me and considered in my medical decision making (see chart for details).     Kosisochukwu Goldberg is a 37 y.o. female with a past medical history significant for asthma and irritable bowel syndrome who presents with bilateral flank pain left worse than right nausea and vomiting.  Patient reports that she has had pain in her flanks for the last few days.  She reports now her pain is in the severe left flank.  She says that she saw her PCP yesterday who thought she had a urinary tract infection/pyelonephritis and was started on amoxicillin.  She reports she has been taking the antibiotics as prescribed but is having worsening nausea, vomiting, and flank pains.  She reports the pain is up to 9 out of 10 in severity.  She denies any constipation or diarrhea.  No recent trauma.  She reports subjective fevers and chills.  She reports no chest pain, shortness of  breath, or extremity symptoms.  She has no pelvic symptoms including a vaginal discharge or vaginal bleeding.  Patient is at hysterectomy.  She reports no history of kidney stones.  On exam, patient has tenderness in her left flank.  Right flank is less tender.  No lower abdominal tenderness.  Lungs clear chest is nontender.  Patient has CVA tenderness on the left and on the right.  Exam otherwise unremarkable.  Based on patient's description of symptoms I am more concerned about a kidney stone over UTI however will recheck her urine.  Urinalysis showed no nitrites or leukocytes, doubt UTI  although patient has been on antibiotics for 2 days, will likely continue antibiotics.  Patient did however have some hemoglobin.  Given the flank pain, will obtain CT imaging to rule out new kidney stone.  Patient given pain medicine nausea medicine and fluids.  Patient with a laboratory testing to look for etiology of flank and abdominal pains.  Anticipate reassessment after workup.   12:30 PM Patient's work results are seen above.  Although the urinalysis showed no leukocytes or nitrites, CT scan shows gas in the bladder which is concerning for UTI especially in the setting of her currently on antibiotics for infection and cloudy urine.  CT scan did not show evidence of acute stone but did show evidence of recent passage with right hydroureteronephrosis and a right-sided stone still in the kidney.  Suspect the patient is having both UTI/pyelonephritis as well as pain from the recently passed stone.  Other laboratory testing reassuring.    Patient's pain improved with Toradol but then began to return.  As patient drove to the ED, we discussed pain management and she elected to instead receive patient for pain medication and be discharged home.    Patient will be changed to Levaquin and will also be given pain medication and nausea medicine.  Patient will follow with PCP for further management.  Final Clinical  Impressions(s) / ED Diagnoses   Final diagnoses:  Kidney stone on right side  Acute cystitis with hematuria    ED Discharge Orders         Ordered    levofloxacin (LEVAQUIN) 750 MG tablet  Daily     05/01/18 1234    oxyCODONE-acetaminophen (PERCOCET/ROXICET) 5-325 MG tablet  Every 4 hours PRN     05/01/18 1234    ondansetron (ZOFRAN) 4 MG tablet  Every 8 hours PRN     05/01/18 1234          Clinical Impression: 1. Kidney stone on right side   2. Acute cystitis with hematuria     Disposition: Discharge  Condition: Good  I have discussed the results, Dx and Tx plan with the pt(& family if present). He/she/they expressed understanding and agree(s) with the plan. Discharge instructions discussed at great length. Strict return precautions discussed and pt &/or family have verbalized understanding of the instructions. No further questions at time of discharge.    New Prescriptions   LEVOFLOXACIN (LEVAQUIN) 750 MG TABLET    Take 1 tablet (750 mg total) by mouth daily for 5 days.   ONDANSETRON (ZOFRAN) 4 MG TABLET    Take 1 tablet (4 mg total) by mouth every 8 (eight) hours as needed for nausea or vomiting.   OXYCODONE-ACETAMINOPHEN (PERCOCET/ROXICET) 5-325 MG TABLET    Take 1 tablet by mouth every 4 (four) hours as needed for severe pain.    Follow Up: Harold Hedge, MD [address] [place] [address] [phone]     [facility] [place] [facility] [address] [id number] [address] [facility] [place] [address] [phone]       Tegeler, Canary Brim, MD 05/01/18 [phone]

## 2018-05-01 NOTE — Discharge Instructions (Signed)
Your work-up today showed evidence of a recently passed kidney stone and a kidney stone still in the kidney.  We suspect you have a bladder infection given the gas in her bladder and your recent treatment for UTI.  Please take the new antibiotic and discontinue the old antibiotic.  Please use the pain medicine and nausea medicine to help with your symptoms.  Please stay hydrated.  Please follow-up with your primary doctor for further management.  If any symptoms change or worsen, please return to the nearest emergency department.

## 2018-05-01 NOTE — ED Triage Notes (Signed)
Right flank pain x3 days.  Saw pmd yesterday and started on Amoxicillin for "kidney infection".  Sts pain is now on the left flank and worse than before.  Started vomiting last evening.

## 2018-05-01 NOTE — ED Notes (Addendum)
ED Provider again at bedside.

## 2018-05-01 NOTE — ED Notes (Signed)
Patient transported to CT 

## 2018-06-09 DIAGNOSIS — S39012A Strain of muscle, fascia and tendon of lower back, initial encounter: Secondary | ICD-10-CM | POA: Diagnosis not present

## 2018-09-20 DIAGNOSIS — M797 Fibromyalgia: Secondary | ICD-10-CM | POA: Diagnosis not present

## 2018-09-20 DIAGNOSIS — Z79899 Other long term (current) drug therapy: Secondary | ICD-10-CM | POA: Diagnosis not present

## 2018-09-20 DIAGNOSIS — Z6822 Body mass index (BMI) 22.0-22.9, adult: Secondary | ICD-10-CM | POA: Diagnosis not present

## 2019-07-16 IMAGING — CT CT RENAL STONE PROTOCOL
2 of 4 series · 16 of 46 positions shown, 18 images · non-contrast
Comparison: CT scan 02/21/2013

CLINICAL DATA: Bilateral flank pain since [REDACTED].

EXAM:
CT ABDOMEN AND PELVIS WITHOUT CONTRAST
TECHNIQUE: Multidetector CT imaging of the abdomen and pelvis was performed
following the standard protocol without IV contrast.

[Series 2: axial st · axial · 0.74mm/px · z∈[-456,-86]mm · 13 of 82 slices shown, 15 images]
[im 4/82  soft-tissue]
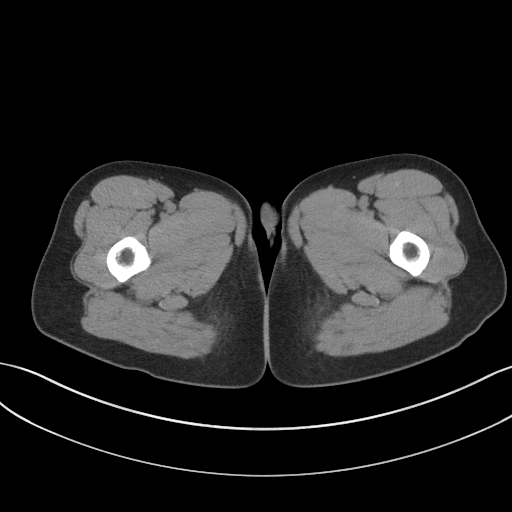
[im 4/82  bone]
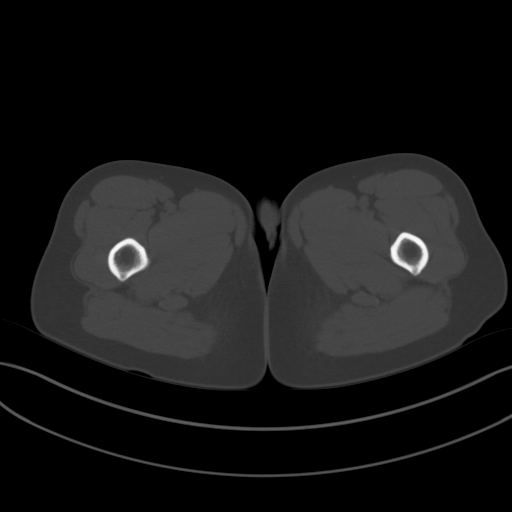
[im 10/82  soft-tissue]
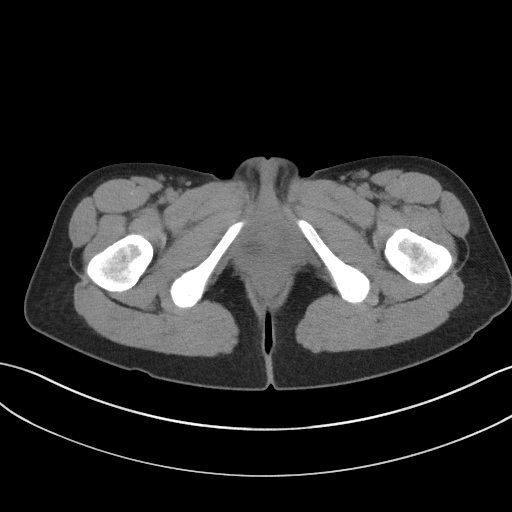
[im 17/82  soft-tissue]
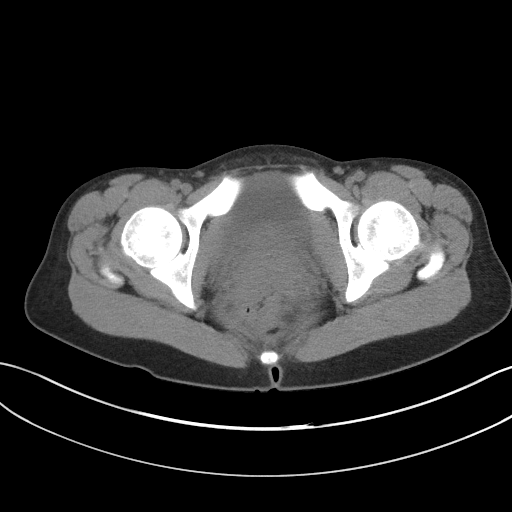
[im 23/82  soft-tissue]
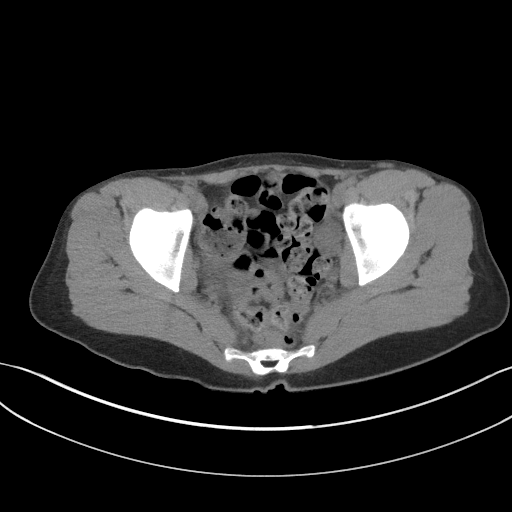
[im 30/82  soft-tissue]
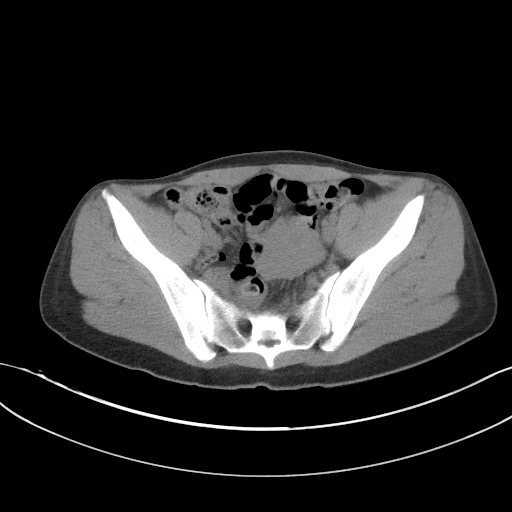
[im 36/82  soft-tissue]
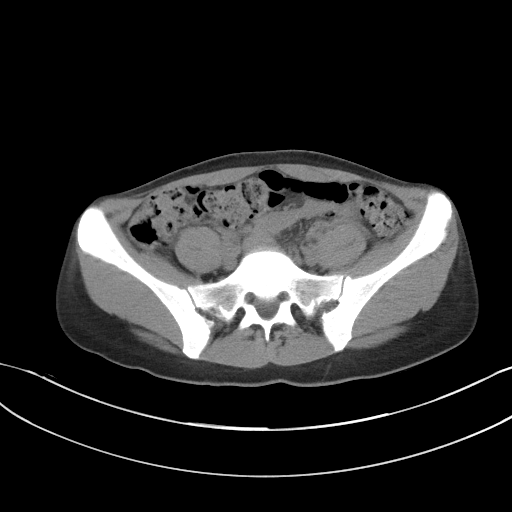
[im 43/82  soft-tissue]
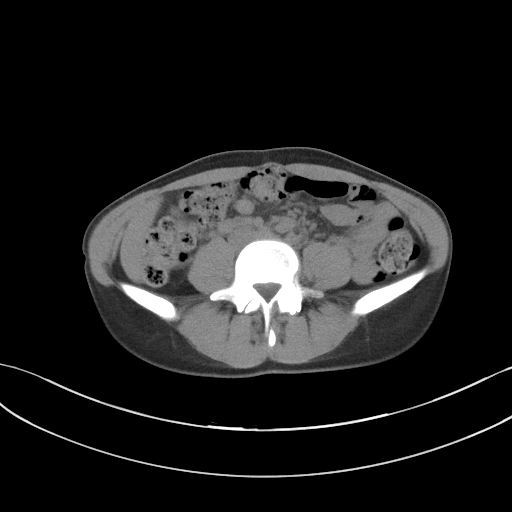
[im 46/82  soft-tissue]
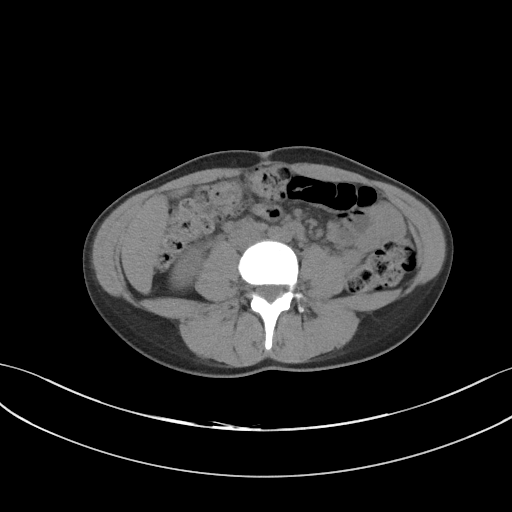
[im 52/82  soft-tissue]
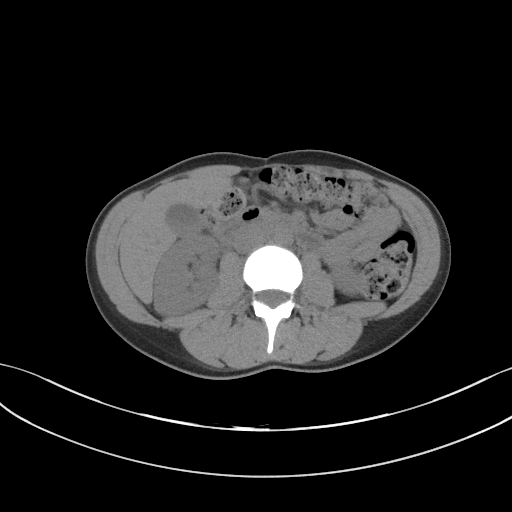
[im 52/82  bone]
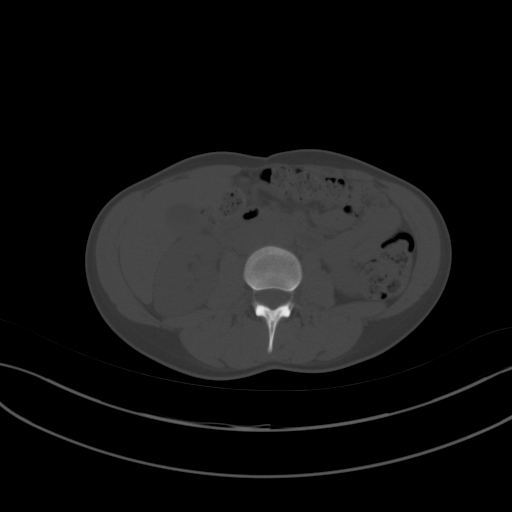
[im 59/82  soft-tissue]
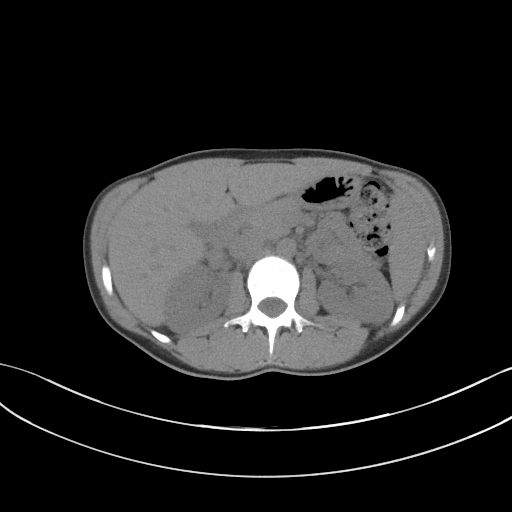
[im 65/82  soft-tissue]
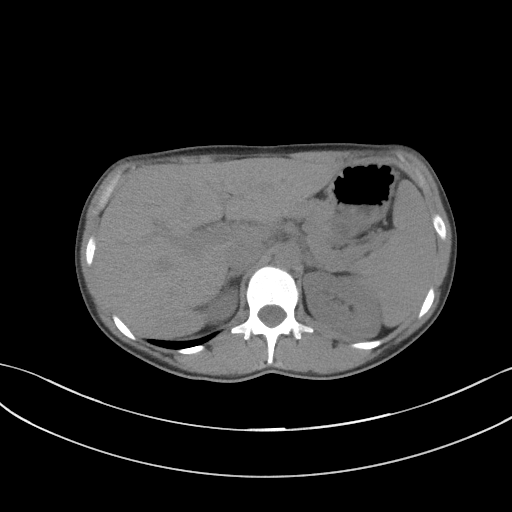
[im 72/82  soft-tissue]
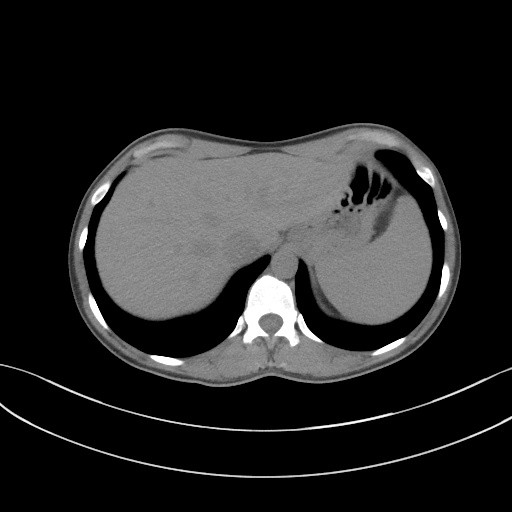
[im 78/82  soft-tissue]
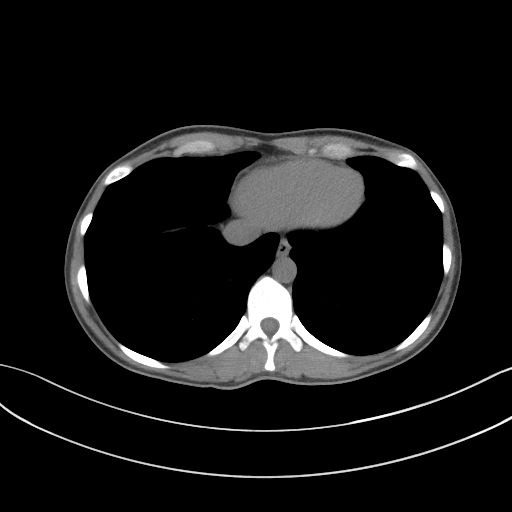

[Series 4: coronal st · coronal · 0.71mm/px · 3 of 67 slices shown]
[im 23/67  soft-tissue]
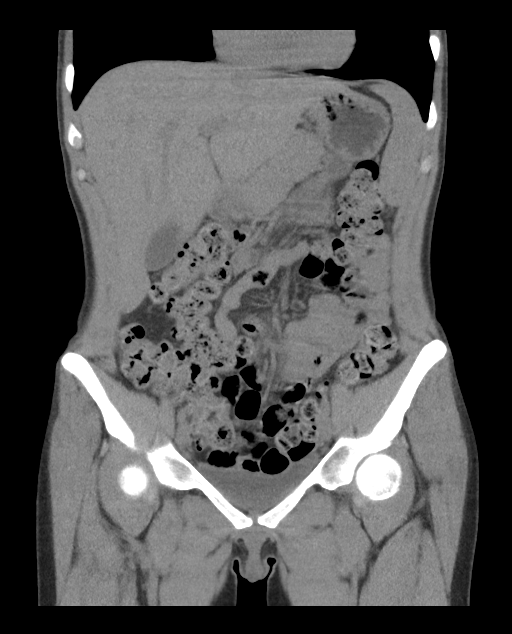
[im 30/67  soft-tissue]
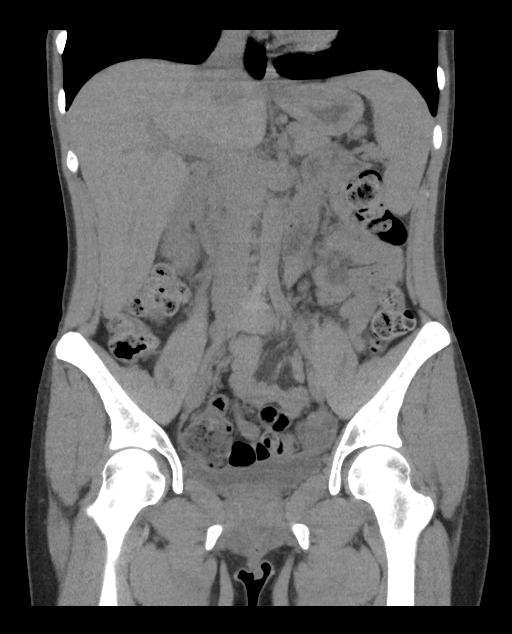
[im 37/67  soft-tissue]
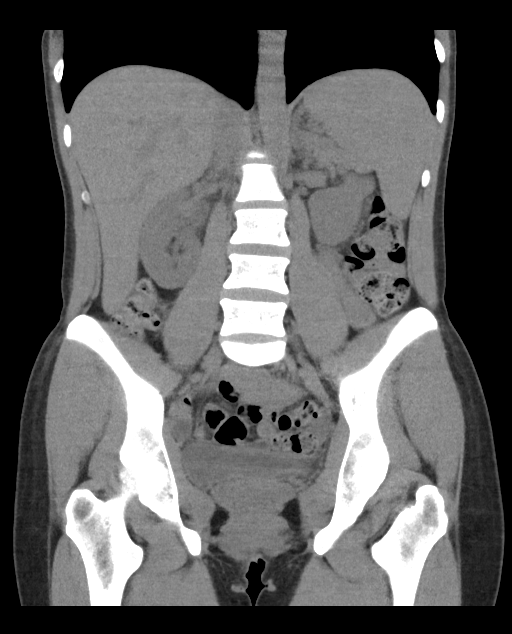

[16 of 46 positions shown; findings below may reference images not displayed]

FINDINGS: Lower chest: The lung bases are clear of acute process. No pleural
effusion or pulmonary lesions. The heart is normal in size. No
pericardial effusion. The distal esophagus and aorta are
unremarkable.

Hepatobiliary: No focal hepatic lesions or intrahepatic biliary
dilatation. The gallbladder appears normal. No common bile duct
dilatation.

Pancreas: No mass, inflammation or ductal dilatation.

Spleen: Normal size.  No focal lesions.

Adrenals/Urinary Tract: The adrenal glands are unremarkable.

Small lower pole right renal calculus is noted. No definite
left-sided renal calculi. There is mild right-sided
hydroureteronephrosis but no obstructing ureteral calculus. I
suspect the patient recently passed a stone on the right side. No
left-sided hydronephrosis or ureteral dilatation.

No worrisome renal lesions. No bladder lesions. There is 1 small dot
of air in the dome of the bladder.

Stomach/Bowel: The stomach, duodenum, small bowel and colon are
grossly normal without oral contrast. No inflammatory changes, mass
lesions or obstructive findings. The terminal ileum and appendix are
normal.

Vascular/Lymphatic: The aorta is normal in caliber. No
atheroscerlotic calcifications. No mesenteric of retroperitoneal
mass or adenopathy. Small scattered lymph nodes are noted.

Reproductive: The uterus is surgically absent. Both ovaries are
still present and appear normal.

Other: There is a right para vaginal cyst noted. This is likely a
Bartholin's gland cyst.

Musculoskeletal: No significant bony findings.
IMPRESSION: 1. Mild right-sided hydroureteronephrosis but no obstructing
ureteral calculus. I suspect the patient recently passed a stone.
2. Small lower pole right renal calculus.
3. No worrisome renal or bladder lesions.
4. Status post hysterectomy. Both ovaries are still present and
appear normal. The appendix is also normal.
5. Incidental right-sided Bartholin's gland cyst.

## 2023-10-20 ENCOUNTER — Other Ambulatory Visit: Payer: Self-pay

## 2023-10-20 ENCOUNTER — Encounter (HOSPITAL_COMMUNITY): Payer: Self-pay

## 2023-10-20 ENCOUNTER — Emergency Department (HOSPITAL_COMMUNITY)
Admission: EM | Admit: 2023-10-20 | Discharge: 2023-10-20 | Disposition: A | Discharge status: Home / Self Care | Attending: Emergency Medicine | Admitting: Emergency Medicine

## 2023-10-20 DIAGNOSIS — N751 Abscess of Bartholin's gland: Secondary | ICD-10-CM | POA: Insufficient documentation

## 2023-10-20 MED ORDER — LIDOCAINE HCL (PF) 1 % IJ SOLN
5.0000 mL | Freq: Once | INTRAMUSCULAR | Status: AC
  Administered 2023-10-20: 5 mL
  Filled 2023-10-20: qty 5

## 2023-10-20 MED ORDER — OXYCODONE-ACETAMINOPHEN 5-325 MG PO TABS: 1.0000 | ORAL_TABLET | Freq: Three times a day (TID) | ORAL | 0 refills | Status: DC | PRN

## 2023-10-20 MED ORDER — OXYCODONE-ACETAMINOPHEN 5-325 MG PO TABS
1.0000 | ORAL_TABLET | Freq: Once | ORAL | Status: AC
  Administered 2023-10-20: 1 via ORAL
  Filled 2023-10-20: qty 1

## 2023-10-20 MED ORDER — OXYCODONE-ACETAMINOPHEN 5-325 MG PO TABS: 1.0000 | ORAL_TABLET | Freq: Three times a day (TID) | ORAL | 0 refills | Status: AC | PRN

## 2023-10-20 NOTE — Discharge Instructions (Addendum)
 You were seen in the emergency department for swelling over the right labia Based on the appearance and the firmness of this area we did not perform another incision and drainage here today after talking with gynecology You should continue taking doxycycline Pick up the prescribed Percocet from your pharmacy and take only as directed for severe pain Do not drink alcohol or drive while taking Percocet Take Tylenol or Motrin as directed for mild to moderate pain Follow-up with Dr.ozan this week in the office for reevaluation and further management You can need to be seen in [place] or in the office in [place].  We have provided both phone numbers Return to the emergency department for severe pain, fevers, chills or any other concerns

## 2023-10-20 NOTE — ED Provider Notes (Signed)
 [place] EMERGENCY DEPARTMENT AT [facility] Provider Note   CSN: [id number] Arrival date & time: 10/20/23  1118     History  Chief Complaint  Patient presents with   Abscess   Vaginal Pain    Kendra Hurley is Hurley 43 y.o. female.  With history of status post total hysterectomy and Bartholin abscesses who presents to the ED for Bartholin abscess.  Over the last week patient has had increasing discomfort related to Bartholin abscess.  She was seen for this reason 2 days ago at another emergency department where incision and drainage was attempted but very small amount was able to be drained.  She is still having significant discomfort and swelling related to the abscess.  No fevers or chills.  No other complaints at this time.  No new sexual partners.   Abscess Vaginal Pain       Home Medications Prior to Admission medications   Medication Sig Start Date End Date Taking? Authorizing Provider  doxycycline (MONODOX) 100 MG capsule Take 100 mg by mouth 2 (two) times daily. 10/19/23 10/26/23 Yes [provider]  oxyCODONE-acetaminophen (PERCOCET/ROXICET) 5-325 MG tablet Take 1 tablet by mouth every 8 (eight) hours as needed for up to 4 days for severe pain (pain score 7-10). 10/20/23 10/24/23 Yes Kendra Foots, DO      Allergies    Sulfur    Review of Systems   Review of Systems  Genitourinary:  Positive for vaginal pain.    Physical Exam Updated Vital Signs BP 112/72   Pulse 96   Temp 98 F (36.7 C)   Resp 14   Ht 5' (1.524 m)   Wt 53.5 kg   LMP 09/22/2013   SpO2 100%   BMI 23.05 kg/m  Physical Exam Genitourinary:    Comments: Approximately 2 x 4 cm area of induration and erythema over right labia majora lateral to introitus       ED Results / Procedures / Treatments   Labs (all labs ordered are listed, but only abnormal results are displayed) Labs Reviewed - No data to display  EKG None  Radiology No results  found.  Procedures Procedures    Medications Ordered in ED Medications  lidocaine (PF) (XYLOCAINE) 1 % injection 5 mL (5 mLs Infiltration Given 10/20/23 1230)  oxyCODONE-acetaminophen (PERCOCET/ROXICET) 5-325 MG per tablet 1 tablet (1 tablet Oral Given 10/20/23 1339)    ED Course/ Medical Decision Making/ Hurley&P Clinical Course as of 10/20/23 1341  Sat Oct 20, 2023  1337 Discussed case with Dr.Ozan (OB/GYN) who has reviewed images in patient's chart.  She agrees that incision and drainage would be unlikely to provide any relief given the degree of induration and lack of fluctuance.  She recommends patient continue antibiotics, warm compresses, pain management and follow-up in the office.  Informed patient of this plan and she is agreeable with plan for discharge with close gynecology follow-up.  Will discharge with short course of Percocet for pain control at home [MP]    Clinical Course User Index [MP] Kendra Foots, DO                                 Medical Decision Making 43 year old female with history as above presents to this ED for unresolved Bartholin's abscess.  Was seen at another ED 2 days ago where I&D was attempted but scant drainage was expressed.  Still with pain and swelling.  No evidence of systemic infection.  This area is very indurated and firm with no clear fluid pocket.  Will speak with gynecology to get further recommendations and direction  Risk Prescription drug management.           Final Clinical Impression(s) / ED Diagnoses Final diagnoses:  Abscess of right Bartholin gland    Rx / DC Orders ED Discharge Orders          Ordered    oxyCODONE-acetaminophen (PERCOCET/ROXICET) 5-325 MG tablet  Every 8 hours PRN        10/20/23 1340              Kendra June A, DO 10/20/23 1341

## 2023-10-20 NOTE — ED Triage Notes (Signed)
 Pt c.o right sided vaginal abscess since Wednesday. Pt was seen at another ER that night with attempted I&D but nothing was able to drain. Pt c.o severe pain and some bleeding from incision.
